# Patient Record
Sex: Female | Born: 1950 | Race: White | Hispanic: No | Smoking: Never smoker
Health system: Southern US, Community
[De-identification: ages and names within clinical notes are randomized; demographics above are authoritative.]

## PROBLEM LIST (undated history)

## (undated) DIAGNOSIS — D731 Hypersplenism: Secondary | ICD-10-CM

## (undated) DIAGNOSIS — R161 Splenomegaly, not elsewhere classified: Secondary | ICD-10-CM

## (undated) DIAGNOSIS — D696 Thrombocytopenia, unspecified: Secondary | ICD-10-CM

## (undated) DIAGNOSIS — R160 Hepatomegaly, not elsewhere classified: Secondary | ICD-10-CM

## (undated) DIAGNOSIS — K7460 Unspecified cirrhosis of liver: Secondary | ICD-10-CM

## (undated) DIAGNOSIS — F32A Depression, unspecified: Secondary | ICD-10-CM

## (undated) DIAGNOSIS — E669 Obesity, unspecified: Secondary | ICD-10-CM

## (undated) DIAGNOSIS — M1990 Unspecified osteoarthritis, unspecified site: Secondary | ICD-10-CM

## (undated) DIAGNOSIS — F329 Major depressive disorder, single episode, unspecified: Secondary | ICD-10-CM

## (undated) DIAGNOSIS — L409 Psoriasis, unspecified: Secondary | ICD-10-CM

## (undated) DIAGNOSIS — I10 Essential (primary) hypertension: Secondary | ICD-10-CM

## (undated) DIAGNOSIS — M8580 Other specified disorders of bone density and structure, unspecified site: Secondary | ICD-10-CM

## (undated) DIAGNOSIS — M545 Low back pain, unspecified: Secondary | ICD-10-CM

## (undated) DIAGNOSIS — D72819 Decreased white blood cell count, unspecified: Secondary | ICD-10-CM

## (undated) DIAGNOSIS — L4050 Arthropathic psoriasis, unspecified: Secondary | ICD-10-CM

## (undated) DIAGNOSIS — D6959 Other secondary thrombocytopenia: Secondary | ICD-10-CM

## (undated) HISTORY — DX: Hypersplenism: D73.1

## (undated) HISTORY — PX: OTHER SURGICAL HISTORY: SHX169

## (undated) HISTORY — DX: Low back pain: M54.5

## (undated) HISTORY — DX: Splenomegaly, not elsewhere classified: R16.1

## (undated) HISTORY — DX: Obesity, unspecified: E66.9

## (undated) HISTORY — DX: Other specified disorders of bone density and structure, unspecified site: M85.80

## (undated) HISTORY — DX: Arthropathic psoriasis, unspecified: L40.50

## (undated) HISTORY — DX: Low back pain, unspecified: M54.50

## (undated) HISTORY — DX: Unspecified osteoarthritis, unspecified site: M19.90

## (undated) HISTORY — DX: Thrombocytopenia, unspecified: D69.6

## (undated) HISTORY — DX: Psoriasis, unspecified: L40.9

## (undated) HISTORY — DX: Hepatomegaly, not elsewhere classified: R16.0

## (undated) HISTORY — DX: Decreased white blood cell count, unspecified: D72.819

## (undated) HISTORY — DX: Major depressive disorder, single episode, unspecified: F32.9

## (undated) HISTORY — DX: Essential (primary) hypertension: I10

## (undated) HISTORY — DX: Unspecified cirrhosis of liver: K74.60

## (undated) HISTORY — DX: Other secondary thrombocytopenia: D69.59

## (undated) HISTORY — DX: Depression, unspecified: F32.A

---

## 1997-12-11 ENCOUNTER — Ambulatory Visit (HOSPITAL_COMMUNITY): Admission: RE | Admit: 1997-12-11 | Discharge: 1997-12-11 | Payer: Self-pay | Admitting: Internal Medicine

## 2000-09-07 ENCOUNTER — Ambulatory Visit (HOSPITAL_BASED_OUTPATIENT_CLINIC_OR_DEPARTMENT_OTHER): Admission: RE | Admit: 2000-09-07 | Discharge: 2000-09-07 | Payer: Self-pay | Admitting: Pulmonary Disease

## 2000-11-20 ENCOUNTER — Encounter: Payer: Self-pay | Admitting: Emergency Medicine

## 2000-11-20 ENCOUNTER — Emergency Department (HOSPITAL_COMMUNITY): Admission: EM | Admit: 2000-11-20 | Discharge: 2000-11-21 | Payer: Self-pay | Admitting: Emergency Medicine

## 2003-10-29 ENCOUNTER — Encounter: Admission: RE | Admit: 2003-10-29 | Discharge: 2003-10-29 | Payer: Self-pay | Admitting: Internal Medicine

## 2004-04-22 ENCOUNTER — Ambulatory Visit: Payer: Self-pay | Admitting: Internal Medicine

## 2004-06-15 ENCOUNTER — Ambulatory Visit: Payer: Self-pay | Admitting: Internal Medicine

## 2004-06-24 ENCOUNTER — Ambulatory Visit: Payer: Self-pay | Admitting: Internal Medicine

## 2004-09-27 ENCOUNTER — Ambulatory Visit: Payer: Self-pay | Admitting: Internal Medicine

## 2004-10-20 ENCOUNTER — Ambulatory Visit: Payer: Self-pay | Admitting: Internal Medicine

## 2004-10-24 ENCOUNTER — Encounter: Admission: RE | Admit: 2004-10-24 | Discharge: 2004-12-09 | Payer: Self-pay | Admitting: Internal Medicine

## 2004-11-10 ENCOUNTER — Ambulatory Visit: Payer: Self-pay | Admitting: Internal Medicine

## 2004-12-09 ENCOUNTER — Ambulatory Visit: Payer: Self-pay | Admitting: Internal Medicine

## 2005-03-14 ENCOUNTER — Ambulatory Visit: Payer: Self-pay | Admitting: Internal Medicine

## 2005-04-04 ENCOUNTER — Ambulatory Visit: Payer: Self-pay | Admitting: Internal Medicine

## 2005-10-17 ENCOUNTER — Ambulatory Visit: Payer: Self-pay | Admitting: Internal Medicine

## 2005-11-16 ENCOUNTER — Ambulatory Visit: Payer: Self-pay | Admitting: Internal Medicine

## 2006-01-18 ENCOUNTER — Ambulatory Visit: Payer: Self-pay | Admitting: Internal Medicine

## 2006-05-01 ENCOUNTER — Ambulatory Visit: Payer: Self-pay | Admitting: Internal Medicine

## 2006-08-03 ENCOUNTER — Ambulatory Visit: Payer: Self-pay | Admitting: Internal Medicine

## 2006-11-21 ENCOUNTER — Ambulatory Visit: Payer: Self-pay | Admitting: Internal Medicine

## 2006-12-12 ENCOUNTER — Ambulatory Visit: Payer: Self-pay | Admitting: Internal Medicine

## 2007-01-24 ENCOUNTER — Ambulatory Visit: Payer: Self-pay | Admitting: Internal Medicine

## 2007-05-01 ENCOUNTER — Encounter: Payer: Self-pay | Admitting: Internal Medicine

## 2007-05-24 ENCOUNTER — Ambulatory Visit: Payer: Self-pay | Admitting: Internal Medicine

## 2007-05-24 DIAGNOSIS — R5381 Other malaise: Secondary | ICD-10-CM

## 2007-05-24 DIAGNOSIS — M949 Disorder of cartilage, unspecified: Secondary | ICD-10-CM

## 2007-05-24 DIAGNOSIS — R5383 Other fatigue: Secondary | ICD-10-CM

## 2007-05-24 DIAGNOSIS — F329 Major depressive disorder, single episode, unspecified: Secondary | ICD-10-CM

## 2007-05-24 DIAGNOSIS — M899 Disorder of bone, unspecified: Secondary | ICD-10-CM | POA: Insufficient documentation

## 2007-05-24 DIAGNOSIS — G4700 Insomnia, unspecified: Secondary | ICD-10-CM

## 2007-05-24 DIAGNOSIS — M1990 Unspecified osteoarthritis, unspecified site: Secondary | ICD-10-CM

## 2007-05-24 DIAGNOSIS — M545 Low back pain: Secondary | ICD-10-CM

## 2007-05-24 DIAGNOSIS — I10 Essential (primary) hypertension: Secondary | ICD-10-CM

## 2007-05-28 ENCOUNTER — Encounter: Payer: Self-pay | Admitting: Internal Medicine

## 2007-06-17 ENCOUNTER — Telehealth: Payer: Self-pay | Admitting: Internal Medicine

## 2007-06-24 ENCOUNTER — Telehealth: Payer: Self-pay | Admitting: Internal Medicine

## 2007-07-02 ENCOUNTER — Emergency Department (HOSPITAL_COMMUNITY): Admission: EM | Admit: 2007-07-02 | Discharge: 2007-07-02 | Payer: Self-pay | Admitting: Family Medicine

## 2007-07-19 ENCOUNTER — Telehealth: Payer: Self-pay | Admitting: Internal Medicine

## 2007-07-22 ENCOUNTER — Encounter: Payer: Self-pay | Admitting: Internal Medicine

## 2007-08-01 ENCOUNTER — Encounter: Payer: Self-pay | Admitting: Internal Medicine

## 2007-08-23 ENCOUNTER — Ambulatory Visit (HOSPITAL_COMMUNITY): Admission: RE | Admit: 2007-08-23 | Discharge: 2007-08-23 | Payer: Self-pay | Admitting: General Surgery

## 2007-09-11 ENCOUNTER — Ambulatory Visit: Payer: Self-pay | Admitting: Internal Medicine

## 2007-09-11 DIAGNOSIS — J309 Allergic rhinitis, unspecified: Secondary | ICD-10-CM | POA: Insufficient documentation

## 2007-09-11 DIAGNOSIS — J019 Acute sinusitis, unspecified: Secondary | ICD-10-CM

## 2007-10-09 ENCOUNTER — Encounter: Admission: RE | Admit: 2007-10-09 | Discharge: 2007-10-09 | Payer: Self-pay | Admitting: General Surgery

## 2007-12-10 ENCOUNTER — Ambulatory Visit: Payer: Self-pay | Admitting: Internal Medicine

## 2007-12-10 LAB — CONVERTED CEMR LAB
AST: 30 units/L (ref 0–37)
Albumin: 3.6 g/dL (ref 3.5–5.2)
Alkaline Phosphatase: 103 units/L (ref 39–117)
BUN: 13 mg/dL (ref 6–23)
Basophils Relative: 1 % (ref 0.0–1.0)
Chloride: 103 meq/L (ref 96–112)
Creatinine, Ser: 0.8 mg/dL (ref 0.4–1.2)
Eosinophils Relative: 2.1 % (ref 0.0–5.0)
Glucose, Bld: 105 mg/dL — ABNORMAL HIGH (ref 70–99)
HCT: 37.7 % (ref 36.0–46.0)
HDL: 39.7 mg/dL (ref >39.0)
MCV: 86.9 fL (ref 78.0–100.0)
Monocytes Absolute: 0.3 10*3/uL (ref 0.1–1.0)
Monocytes Relative: 7.2 % (ref 3.0–12.0)
Neutrophils Relative %: 66.3 % (ref 43.0–77.0)
Platelets: 109 10*3/uL — ABNORMAL LOW (ref 150–400)
Potassium: 4 meq/L (ref 3.5–5.1)
RBC: 4.33 M/uL (ref 3.87–5.11)
TSH: 2.01 microintl units/mL (ref 0.35–5.50)
Total CHOL/HDL Ratio: 4.2
Total Protein: 6.5 g/dL (ref 6.0–8.3)
WBC: 3.5 10*3/uL — ABNORMAL LOW (ref 4.5–10.5)

## 2007-12-23 ENCOUNTER — Ambulatory Visit: Payer: Self-pay | Admitting: Internal Medicine

## 2008-02-24 ENCOUNTER — Telehealth (INDEPENDENT_AMBULATORY_CARE_PROVIDER_SITE_OTHER): Payer: Self-pay | Admitting: *Deleted

## 2008-02-25 ENCOUNTER — Ambulatory Visit: Payer: Self-pay | Admitting: Internal Medicine

## 2008-02-25 DIAGNOSIS — J0100 Acute maxillary sinusitis, unspecified: Secondary | ICD-10-CM

## 2008-04-21 ENCOUNTER — Ambulatory Visit: Payer: Self-pay | Admitting: Internal Medicine

## 2008-04-21 LAB — CONVERTED CEMR LAB
AST: 27 units/L (ref 0–37)
Basophils Absolute: 0 10*3/uL (ref 0.0–0.1)
Basophils Relative: 1 % (ref 0.0–3.0)
Bilirubin Urine: NEGATIVE
Bilirubin, Direct: 0.2 mg/dL (ref 0.0–0.3)
Chloride: 108 meq/L (ref 96–112)
Creatinine, Ser: 0.8 mg/dL (ref 0.4–1.2)
Eosinophils Absolute: 0.1 10*3/uL (ref 0.0–0.7)
GFR calc non Af Amer: 78 mL/min
Hemoglobin, Urine: NEGATIVE
Ketones, ur: NEGATIVE mg/dL
MCHC: 33.9 g/dL (ref 30.0–36.0)
MCV: 87.6 fL (ref 78.0–100.0)
Neutrophils Relative %: 65.7 % (ref 43.0–77.0)
Platelets: 115 10*3/uL — ABNORMAL LOW (ref 150–400)
RBC: 4.46 M/uL (ref 3.87–5.11)
RDW: 13.8 % (ref 11.5–14.6)
Sodium: 143 meq/L (ref 135–145)
TSH: 2.62 microintl units/mL (ref 0.35–5.50)
Total Bilirubin: 1.1 mg/dL (ref 0.3–1.2)
Urine Glucose: NEGATIVE mg/dL
Urobilinogen, UA: 1 (ref 0.0–1.0)

## 2008-04-28 ENCOUNTER — Ambulatory Visit: Payer: Self-pay | Admitting: Internal Medicine

## 2008-06-25 ENCOUNTER — Telehealth: Payer: Self-pay | Admitting: Internal Medicine

## 2008-07-28 ENCOUNTER — Ambulatory Visit: Payer: Self-pay | Admitting: Internal Medicine

## 2008-07-29 LAB — CONVERTED CEMR LAB
BUN: 9 mg/dL (ref 6–23)
Calcium: 9.2 mg/dL (ref 8.4–10.5)
Eosinophils Relative: 4.2 % (ref 0.0–5.0)
GFR calc Af Amer: 110 mL/min
Glucose, Bld: 130 mg/dL — ABNORMAL HIGH (ref 70–99)
HCT: 39.3 % (ref 36.0–46.0)
Hemoglobin: 13.5 g/dL (ref 12.0–15.0)
Monocytes Absolute: 0.2 10*3/uL (ref 0.1–1.0)
Monocytes Relative: 3.9 % (ref 3.0–12.0)
Neutro Abs: 3.1 10*3/uL (ref 1.4–7.7)
RDW: 13.5 % (ref 11.5–14.6)
WBC: 4.4 10*3/uL — ABNORMAL LOW (ref 4.5–10.5)

## 2008-09-17 ENCOUNTER — Telehealth: Payer: Self-pay | Admitting: Internal Medicine

## 2008-09-22 ENCOUNTER — Telehealth: Payer: Self-pay | Admitting: Internal Medicine

## 2008-09-22 ENCOUNTER — Encounter: Payer: Self-pay | Admitting: Internal Medicine

## 2008-10-27 ENCOUNTER — Telehealth: Payer: Self-pay | Admitting: Internal Medicine

## 2008-10-28 ENCOUNTER — Ambulatory Visit: Payer: Self-pay | Admitting: Internal Medicine

## 2008-10-29 LAB — CONVERTED CEMR LAB
BUN: 10 mg/dL (ref 6–23)
Basophils Absolute: 0 10*3/uL (ref 0.0–0.1)
Calcium: 8.9 mg/dL (ref 8.4–10.5)
GFR calc non Af Amer: 77.94 mL/min (ref >60)
Glucose, Bld: 102 mg/dL — ABNORMAL HIGH (ref 70–99)
Hgb A1c MFr Bld: 4.9 % (ref 4.6–6.5)
Lymphocytes Relative: 18.5 % (ref 12.0–46.0)
Lymphs Abs: 0.7 10*3/uL (ref 0.7–4.0)
Monocytes Relative: 7.3 % (ref 3.0–12.0)
Platelets: 93 10*3/uL — ABNORMAL LOW (ref 150.0–400.0)
RDW: 13.3 % (ref 11.5–14.6)

## 2008-11-03 ENCOUNTER — Ambulatory Visit: Payer: Self-pay | Admitting: Internal Medicine

## 2008-11-03 DIAGNOSIS — H02829 Cysts of unspecified eye, unspecified eyelid: Secondary | ICD-10-CM

## 2009-02-01 ENCOUNTER — Ambulatory Visit: Payer: Self-pay | Admitting: Internal Medicine

## 2009-02-01 LAB — CONVERTED CEMR LAB
Basophils Relative: 0.1 % (ref 0.0–3.0)
Chloride: 107 meq/L (ref 96–112)
Creatinine, Ser: 0.7 mg/dL (ref 0.4–1.2)
Eosinophils Relative: 2.2 % (ref 0.0–5.0)
GFR calc non Af Amer: 90.84 mL/min (ref >60)
Lymphocytes Relative: 21.1 % (ref 12.0–46.0)
MCV: 87.9 fL (ref 78.0–100.0)
Monocytes Absolute: 0.2 10*3/uL (ref 0.1–1.0)
Monocytes Relative: 5.1 % (ref 3.0–12.0)
Neutrophils Relative %: 71.5 % (ref 43.0–77.0)
RBC: 4.61 M/uL (ref 3.87–5.11)
WBC: 4 10*3/uL — ABNORMAL LOW (ref 4.5–10.5)

## 2009-02-05 ENCOUNTER — Ambulatory Visit: Payer: Self-pay | Admitting: Internal Medicine

## 2009-02-05 LAB — CONVERTED CEMR LAB: IgE (Immunoglobulin E), Serum: 22.7 intl units/mL (ref 0.0–180.0)

## 2009-04-06 ENCOUNTER — Ambulatory Visit: Payer: Self-pay | Admitting: Internal Medicine

## 2009-04-06 DIAGNOSIS — L659 Nonscarring hair loss, unspecified: Secondary | ICD-10-CM | POA: Insufficient documentation

## 2009-04-07 LAB — CONVERTED CEMR LAB
AST: 32 units/L (ref 0–37)
Albumin: 3.8 g/dL (ref 3.5–5.2)
Alkaline Phosphatase: 116 units/L (ref 39–117)
Basophils Relative: 0.5 % (ref 0.0–3.0)
Bilirubin Urine: NEGATIVE
Bilirubin, Direct: 0.2 mg/dL (ref 0.0–0.3)
Calcium: 9.1 mg/dL (ref 8.4–10.5)
GFR calc non Af Amer: 67.93 mL/min (ref >60)
Hemoglobin, Urine: NEGATIVE
Hemoglobin: 14.2 g/dL (ref 12.0–15.0)
Lymphocytes Relative: 21.8 % (ref 12.0–46.0)
Monocytes Relative: 4.2 % (ref 3.0–12.0)
Neutro Abs: 3.2 10*3/uL (ref 1.4–7.7)
Neutrophils Relative %: 71.5 % (ref 43.0–77.0)
Nitrite: NEGATIVE
Potassium: 4.2 meq/L (ref 3.5–5.1)
Pro B Natriuretic peptide (BNP): 29 pg/mL (ref 0.0–100.0)
RBC: 4.56 M/uL (ref 3.87–5.11)
Sodium: 142 meq/L (ref 135–145)
Total Protein, Urine: NEGATIVE mg/dL
Total Protein: 7 g/dL (ref 6.0–8.3)
Urine Glucose: NEGATIVE mg/dL
Vitamin B-12: 504 pg/mL (ref 211–911)
WBC: 4.5 10*3/uL (ref 4.5–10.5)
pH: 7 (ref 5.0–8.0)

## 2009-04-19 ENCOUNTER — Telehealth: Payer: Self-pay | Admitting: Internal Medicine

## 2009-05-25 ENCOUNTER — Ambulatory Visit: Payer: Self-pay | Admitting: Internal Medicine

## 2009-05-25 DIAGNOSIS — M25559 Pain in unspecified hip: Secondary | ICD-10-CM | POA: Insufficient documentation

## 2009-07-27 ENCOUNTER — Ambulatory Visit: Payer: Self-pay | Admitting: Internal Medicine

## 2009-09-06 ENCOUNTER — Telehealth: Payer: Self-pay | Admitting: Internal Medicine

## 2009-09-07 ENCOUNTER — Encounter: Admission: RE | Admit: 2009-09-07 | Discharge: 2009-09-07 | Payer: Self-pay | Admitting: Rheumatology

## 2009-09-07 ENCOUNTER — Encounter: Payer: Self-pay | Admitting: Internal Medicine

## 2009-09-08 ENCOUNTER — Telehealth: Payer: Self-pay | Admitting: Internal Medicine

## 2009-09-08 DIAGNOSIS — E039 Hypothyroidism, unspecified: Secondary | ICD-10-CM | POA: Insufficient documentation

## 2009-10-14 ENCOUNTER — Ambulatory Visit: Payer: Self-pay | Admitting: Internal Medicine

## 2009-10-18 ENCOUNTER — Ambulatory Visit: Payer: Self-pay | Admitting: Internal Medicine

## 2009-10-18 LAB — CONVERTED CEMR LAB: TSH: 4.04 microintl units/mL (ref 0.35–5.50)

## 2009-10-20 ENCOUNTER — Ambulatory Visit: Payer: Self-pay | Admitting: Internal Medicine

## 2009-10-20 DIAGNOSIS — R609 Edema, unspecified: Secondary | ICD-10-CM

## 2009-11-04 ENCOUNTER — Encounter: Payer: Self-pay | Admitting: Internal Medicine

## 2009-11-05 ENCOUNTER — Ambulatory Visit: Payer: Self-pay | Admitting: Oncology

## 2009-11-12 ENCOUNTER — Encounter: Payer: Self-pay | Admitting: Internal Medicine

## 2009-11-12 LAB — COMPREHENSIVE METABOLIC PANEL
ALT: 18 U/L (ref 0–35)
AST: 25 U/L (ref 0–37)
Alkaline Phosphatase: 105 U/L (ref 39–117)
Sodium: 139 mEq/L (ref 135–145)
Total Bilirubin: 1 mg/dL (ref 0.3–1.2)
Total Protein: 6.9 g/dL (ref 6.0–8.3)

## 2009-11-12 LAB — MORPHOLOGY: PLT EST: DECREASED

## 2009-11-12 LAB — CBC WITH DIFFERENTIAL/PLATELET
Basophils Absolute: 0 10*3/uL (ref 0.0–0.1)
Eosinophils Absolute: 0.1 10*3/uL (ref 0.0–0.5)
HGB: 12.2 g/dL (ref 11.6–15.9)
MCV: 86.5 fL (ref 79.5–101.0)
MONO%: 6.1 % (ref 0.0–14.0)
NEUT#: 2.3 10*3/uL (ref 1.5–6.5)
RDW: 14.3 % (ref 11.2–14.5)
lymph#: 0.8 10*3/uL — ABNORMAL LOW (ref 0.9–3.3)

## 2009-11-12 LAB — CHCC SMEAR

## 2009-11-15 ENCOUNTER — Telehealth: Payer: Self-pay | Admitting: Internal Medicine

## 2009-11-16 LAB — PROTEIN ELECTROPHORESIS, SERUM
Albumin ELP: 55.6 % — ABNORMAL LOW (ref 55.8–66.1)
Alpha-1-Globulin: 4.4 % (ref 2.9–4.9)
Beta 2: 4.1 % (ref 3.2–6.5)
Beta Globulin: 6.5 % (ref 4.7–7.2)

## 2009-11-16 LAB — HEPATITIS B SURFACE ANTIGEN: Hepatitis B Surface Ag: NEGATIVE

## 2009-11-16 LAB — HEPATITIS B CORE ANTIBODY, TOTAL: Hep B Core Total Ab: NEGATIVE

## 2009-11-16 LAB — HEPATITIS C ANTIBODY: HCV Ab: NEGATIVE

## 2009-11-16 LAB — HEPATITIS B SURFACE ANTIBODY,QUALITATIVE: Hep B S Ab: NEGATIVE

## 2009-11-16 LAB — HIV ANTIBODY (ROUTINE TESTING W REFLEX)

## 2009-11-24 ENCOUNTER — Ambulatory Visit (HOSPITAL_COMMUNITY): Admission: RE | Admit: 2009-11-24 | Discharge: 2009-11-24 | Payer: Self-pay | Admitting: Oncology

## 2009-11-29 ENCOUNTER — Ambulatory Visit (HOSPITAL_COMMUNITY)
Admission: RE | Admit: 2009-11-29 | Discharge: 2009-11-29 | Payer: Self-pay | Source: Home / Self Care | Admitting: Oncology

## 2009-12-08 ENCOUNTER — Ambulatory Visit: Payer: Self-pay | Admitting: Oncology

## 2009-12-08 ENCOUNTER — Telehealth: Payer: Self-pay | Admitting: Internal Medicine

## 2009-12-10 ENCOUNTER — Encounter: Payer: Self-pay | Admitting: Internal Medicine

## 2009-12-10 LAB — CBC WITH DIFFERENTIAL/PLATELET
Basophils Absolute: 0 10*3/uL (ref 0.0–0.1)
EOS%: 4.1 % (ref 0.0–7.0)
HGB: 13 g/dL (ref 11.6–15.9)
MCH: 30.3 pg (ref 25.1–34.0)
NEUT#: 2.2 10*3/uL (ref 1.5–6.5)
RDW: 14.8 % — ABNORMAL HIGH (ref 11.2–14.5)
WBC: 3.3 10*3/uL — ABNORMAL LOW (ref 3.9–10.3)
lymph#: 0.7 10*3/uL — ABNORMAL LOW (ref 0.9–3.3)

## 2009-12-13 ENCOUNTER — Telehealth: Payer: Self-pay | Admitting: Internal Medicine

## 2010-01-14 ENCOUNTER — Ambulatory Visit: Payer: Self-pay | Admitting: Internal Medicine

## 2010-01-14 LAB — CONVERTED CEMR LAB
CO2: 31 meq/L (ref 19–32)
Chloride: 108 meq/L (ref 96–112)
Creatinine, Ser: 0.7 mg/dL (ref 0.4–1.2)
Potassium: 4.3 meq/L (ref 3.5–5.1)
TSH: 1.37 microintl units/mL (ref 0.35–5.50)

## 2010-01-26 ENCOUNTER — Ambulatory Visit: Payer: Self-pay | Admitting: Internal Medicine

## 2010-01-26 DIAGNOSIS — R160 Hepatomegaly, not elsewhere classified: Secondary | ICD-10-CM | POA: Insufficient documentation

## 2010-01-27 ENCOUNTER — Encounter: Payer: Self-pay | Admitting: Internal Medicine

## 2010-02-03 ENCOUNTER — Ambulatory Visit: Payer: Self-pay | Admitting: Oncology

## 2010-02-08 LAB — CBC WITH DIFFERENTIAL/PLATELET
Basophils Absolute: 0 10*3/uL (ref 0.0–0.1)
Eosinophils Absolute: 0.2 10*3/uL (ref 0.0–0.5)
HCT: 37.2 % (ref 34.8–46.6)
HGB: 12.6 g/dL (ref 11.6–15.9)
LYMPH%: 19.8 % (ref 14.0–49.7)
MONO#: 0.2 10*3/uL (ref 0.1–0.9)
NEUT#: 2.3 10*3/uL (ref 1.5–6.5)
NEUT%: 67.4 % (ref 38.4–76.8)
Platelets: 91 10*3/uL — ABNORMAL LOW (ref 145–400)
WBC: 3.4 10*3/uL — ABNORMAL LOW (ref 3.9–10.3)

## 2010-02-08 LAB — MORPHOLOGY: PLT EST: DECREASED

## 2010-02-08 LAB — CHCC SMEAR

## 2010-03-28 ENCOUNTER — Telehealth: Payer: Self-pay | Admitting: Internal Medicine

## 2010-03-31 ENCOUNTER — Telehealth: Payer: Self-pay | Admitting: Internal Medicine

## 2010-04-13 ENCOUNTER — Ambulatory Visit (HOSPITAL_BASED_OUTPATIENT_CLINIC_OR_DEPARTMENT_OTHER): Payer: BC Managed Care – PPO | Admitting: Oncology

## 2010-04-15 ENCOUNTER — Encounter: Payer: Self-pay | Admitting: Internal Medicine

## 2010-04-15 LAB — COMPREHENSIVE METABOLIC PANEL
ALT: 18 U/L (ref 0–35)
Albumin: 3.9 g/dL (ref 3.5–5.2)
CO2: 27 mEq/L (ref 19–32)
Calcium: 9.1 mg/dL (ref 8.4–10.5)
Chloride: 106 mEq/L (ref 96–112)
Glucose, Bld: 110 mg/dL — ABNORMAL HIGH (ref 70–99)
Potassium: 4.2 mEq/L (ref 3.5–5.3)
Sodium: 142 mEq/L (ref 135–145)
Total Bilirubin: 1.2 mg/dL (ref 0.3–1.2)
Total Protein: 7 g/dL (ref 6.0–8.3)

## 2010-04-15 LAB — CBC WITH DIFFERENTIAL/PLATELET
Basophils Absolute: 0 10*3/uL (ref 0.0–0.1)
EOS%: 3.1 % (ref 0.0–7.0)
HGB: 13.1 g/dL (ref 11.6–15.9)
LYMPH%: 22.4 % (ref 14.0–49.7)
MCH: 29.3 pg (ref 25.1–34.0)
MCV: 89.7 fL (ref 79.5–101.0)
MONO%: 5.9 % (ref 0.0–14.0)
Platelets: 86 10*3/uL — ABNORMAL LOW (ref 145–400)
RDW: 14.3 % (ref 11.2–14.5)

## 2010-04-15 LAB — MORPHOLOGY

## 2010-04-15 LAB — CHCC SMEAR

## 2010-04-15 LAB — LACTATE DEHYDROGENASE: LDH: 206 U/L (ref 94–250)

## 2010-05-09 ENCOUNTER — Telehealth: Payer: Self-pay | Admitting: Internal Medicine

## 2010-05-26 ENCOUNTER — Ambulatory Visit: Payer: Self-pay | Admitting: Internal Medicine

## 2010-06-02 ENCOUNTER — Ambulatory Visit: Payer: Self-pay | Admitting: Internal Medicine

## 2010-06-02 DIAGNOSIS — D693 Immune thrombocytopenic purpura: Secondary | ICD-10-CM

## 2010-06-07 LAB — CONVERTED CEMR LAB
Albumin: 3.6 g/dL (ref 3.5–5.2)
Alkaline Phosphatase: 96 units/L (ref 39–117)
Basophils Absolute: 0 10*3/uL (ref 0.0–0.1)
Basophils Relative: 0.5 % (ref 0.0–3.0)
CO2: 28 meq/L (ref 19–32)
Chloride: 102 meq/L (ref 96–112)
Cholesterol: 164 mg/dL (ref 0–200)
Eosinophils Absolute: 0.1 10*3/uL (ref 0.0–0.7)
Glucose, Bld: 90 mg/dL (ref 70–99)
HCT: 39.8 % (ref 36.0–46.0)
HDL: 43.5 mg/dL (ref >39.00)
Hemoglobin: 13.3 g/dL (ref 12.0–15.0)
Ketones, ur: NEGATIVE mg/dL
Leukocytes, UA: NEGATIVE
Lymphocytes Relative: 22.8 % (ref 12.0–46.0)
Lymphs Abs: 0.9 10*3/uL (ref 0.7–4.0)
MCHC: 33.5 g/dL (ref 30.0–36.0)
MCV: 89.6 fL (ref 78.0–100.0)
Monocytes Absolute: 0.3 10*3/uL (ref 0.1–1.0)
Neutro Abs: 2.7 10*3/uL (ref 1.4–7.7)
Nitrite: NEGATIVE
Potassium: 4.1 meq/L (ref 3.5–5.1)
RBC: 4.44 M/uL (ref 3.87–5.11)
RDW: 14.8 % — ABNORMAL HIGH (ref 11.5–14.6)
Sodium: 138 meq/L (ref 135–145)
Specific Gravity, Urine: 1.01 (ref 1.000–1.030)
TSH: 1.12 microintl units/mL (ref 0.35–5.50)
Total Protein, Urine: NEGATIVE mg/dL
Total Protein: 7 g/dL (ref 6.0–8.3)
VLDL: 19.4 mg/dL (ref 0.0–40.0)
pH: 7 (ref 5.0–8.0)

## 2010-06-10 ENCOUNTER — Telehealth: Payer: Self-pay | Admitting: Internal Medicine

## 2010-06-30 ENCOUNTER — Ambulatory Visit
Admission: RE | Admit: 2010-06-30 | Discharge: 2010-06-30 | Payer: Self-pay | Source: Home / Self Care | Attending: Internal Medicine | Admitting: Internal Medicine

## 2010-07-07 NOTE — Letter (Signed)
Summary: [facility]  [facility]   Imported By: Sherian Rein 09/29/2009 08:06:16  _____________________________________________________________________  External Attachment:    Type:   Image     Comment:   External Document

## 2010-07-07 NOTE — Progress Notes (Signed)
Summary: Vicodin   Phone Note From Pharmacy   Summary of Call: Rec rf request from [facility] and [facility] for Vicodin 5/500mg .  I spoke to pt and advised her Rf was called in to [place] last week.  She states she did not request Rf there and wishes to use [facility].  I called [facility] and they state RX is ready to be picked up.  I advised [facility] pharm to put medication back and to void the RX.  Rf called into [facility] per pt's request and Dr. Loren Racer authorization.   Initial call taken by: Lanier Prude, [facility]),  December 13, 2009 11:15 AM

## 2010-07-07 NOTE — Progress Notes (Signed)
  Phone Note Other Incoming   Caller: Pt- work # [phone] Summary of Call: pt states she has had sinus infection x 2 days, with green mucous. Can you call something in or do you want pt to be seen? Initial call taken by: Ami Bullins CMA,  November 15, 2009 9:19 AM  Follow-up for Phone Call        OK [facility] Follow-up by: Tresa Garter MD,  November 15, 2009 4:55 PM    New/Updated Medications: ZITHROMAX Z-PAK 250 MG TABS (AZITHROMYCIN) as dirrected Prescriptions: ZITHROMAX Z-PAK 250 MG TABS (AZITHROMYCIN) as dirrected  #1 x 0   Entered by:   Ami Bullins CMA   Authorized by:   Tresa Garter MD   Signed by:   Bill Salinas CMA on 11/16/2009   Method used:   Electronically to        [facility] (retail)       [address]       [place], [place]  [address]       Ph: [phone]       Fax: [phone]   RxID:   [id number]

## 2010-07-07 NOTE — Progress Notes (Signed)
Summary: vicodin refill  Phone Note Refill Request Message from:  Fax from Pharmacy on September 06, 2009 2:28 PM  Refills Requested: Medication #1:  VICODIN 5-500 MG TABS 1-2 tab two times a day as needed pain Next Appointment Scheduled: 10-04-09 Initial call taken by: Lucious Groves,  September 06, 2009 2:28 PM  Follow-up for Phone Call        ok x 2 ref Follow-up by: Tresa Garter MD,  September 06, 2009 5:12 PM    Prescriptions: VICODIN 5-500 MG TABS (HYDROCODONE-ACETAMINOPHEN) 1-2 tab two times a day as needed pain  #120 x 2   Entered by:   Lamar Sprinkles, CMA   Authorized by:   Tresa Garter MD   Signed by:   Lamar Sprinkles, CMA on 09/07/2009   Method used:   Telephoned to ...       [facility]  [address] [phone]* (retail)       [address]       [place], [place]  [address]       Ph: [phone]       Fax: [phone]   RxID:   [id number]

## 2010-07-07 NOTE — Assessment & Plan Note (Signed)
Summary: PNEMONIA INJ/AVP Natale Milch  Nurse Visit   Allergies: 1)  ! Pcn 2)  Maxzide 3)  Enalapril Maleate 4)  Ibuprofen 5)  Spironolactone  Immunizations Administered:  Pneumonia Vaccine:    Vaccine Type: Pneumovax    Site: right deltoid    Mfr: Merck    Dose: 0.5 ml    Route: IM    Given by: Lamar Sprinkles, CMA    Exp. Date: 01/15/2011    Lot #: [id number]    VIS given: 01/01/96 version given Oct 14, 2009.  Orders Added: 1)  Pneumococcal Vaccine [90732] 2)  Admin 1st Vaccine [04540]

## 2010-07-07 NOTE — Assessment & Plan Note (Signed)
Summary: 3 MO ROV /NWS  #   Vital Signs:  Patient profile:   60 year old female Height:      63 inches Weight:      267 pounds BMI:     47.47 O2 Sat:      96 % on Room air Temp:     98.2 degrees F oral Pulse rate:   78 / minute Pulse rhythm:   regular Resp:     16 per minute BP sitting:   128 / 74  (left arm) Cuff size:   large  Vitals Entered By: Lanier Prude, CMA(AAMA) (January 26, 2010 10:18 AM)  O2 Flow:  Room air CC: f/u Is Patient Diabetic? No Comments pt is not using Dovonex or Mometasone   CC:  f/u.  History of Present Illness: The patient presents for a follow up of  low plts, hepatomegaly, hypertension, hyperlipidemia, alopecia   Preventive Screening-Counseling & Management  Caffeine-Diet-Exercise     Does Patient Exercise: no  Current Medications (verified): 1)  Temazepam 15 Mg Caps (Temazepam) .Marland Kitchen.. 1-2 Prn Insomnia 2)  Vicodin 5-500 Mg Tabs (Hydrocodone-Acetaminophen) .Marland Kitchen.. 1-2 Tab Two Times A Day As Needed Pain 3)  Cozaar 100 Mg Tabs (Losartan Potassium) .... Take 1 Tablet By Mouth Once A Day 4)  Vitamin D3 1000 Unit  Tabs (Cholecalciferol) .Marland Kitchen.. 1 Qd 5)  Vitamin B-12 1000 Mcg  Tabs (Cyanocobalamin) .Marland Kitchen.. 1 Qd 6)  Loratadine 10 Mg  Tabs (Loratadine) .... Once Daily As Needed Allergies 7)  Veramyst 27.5 Mcg/spray Susp (Fluticasone Furoate) .... 2 Puffs Each Nostril  Q. Day 8)  Triamcinolone Acetonide 0.1 % Oint (Triamcinolone Acetonide) .... Use Bid 9)  Acidophilus  Caps (Lactobacillus) .... As Needed 10)  Triamcinolone 0.5% Cream in Eucerin Cream 1:3 .... Use Two Times A Day Prn 11)  Dovonex 0.005 % Crea (Calcipotriene) .... Use Bid 12)  Mometasone Furoate 0.1 % Oint (Mometasone Furoate) .... Use Two Times A Day - Three Times A Day Prn 13)  Synthroid 50 Mcg Tabs (Levothyroxine Sodium) .Marland Kitchen.. 1 By Mouth Once Daily For Thyroid 14)  Furosemide 40 Mg Tabs (Furosemide) .Marland Kitchen.. 1 By Mouth Q Am ( A Water Pill) 15)  Klor-Con M10 10 Meq Cr-Tabs (Potassium Chloride Crys  Cr) .Marland Kitchen.. 1 By Mouth Qd  Allergies (verified): 1)  ! 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 Family History of Arthritis Family History Hypertension  Social History: Reviewed history from 05/24/2007 and no changes required. 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         Lost wt on diet  Physical Exam  General:  overweight-appearing.   Head:  Normocephalic and atraumatic without obvious abnormalities. Ears:  External ear exam shows rash.  Otoscopic examination reveals clear canals, tympanic membranes are intact bilaterally without bulging, retraction, inflammation or discharge. Hearing is grossly normal bilaterally. Nose:  Erythematous throat mucosa and intranasal erythema.  Mouth:  Oral mucosa and oropharynx without lesions or exudates.  Teeth in  good repair. Neck:  No deformities, masses, or tenderness noted. Lungs:  normal respiratory effort, no intercostal retractions, no accessory muscle use, normal breath sounds, no dullness, and no fremitus.   Heart:  normal rate, regular rhythm, no murmur, no gallop, and no rub.   Abdomen:  soft, non-tender, normal bowel sounds, no hepatomegaly, and no splenomegaly.   Msk:  Lumbar-sacral spine is tender to palpation over paraspinal muscles and painfull with the ROM  Extremities:  trace left pedal  edema and trace right pedal edema.   Neurologic:  No cranial nerve deficits noted. Station and gait are normal. Plantar reflexes are down-going bilaterally. DTRs are symmetrical throughout. Sensory, motor and coordinative functions appear intact. Skin:   extensive psoriasis rash all over. Hair thinned some Psych:  Oriented X3, memory intact for recent and remote, normally interactive, good eye contact, not anxious appearing, not depressed appearing, and not agitated.     Contraindications/Deferment of Procedures/Staging:    Treatment: Flu Shot    Contraindication: other   Impression & Recommendations:  Problem # 1:  HYPOTHYROIDISM (ICD-244.9) Assessment Unchanged  Her updated medication list for this problem includes:    Synthroid 50 Mcg Tabs (Levothyroxine sodium) .Marland Kitchen... 1 by mouth once daily for thyroid The labs were reviewed with the patient.   Problem # 2:  EDEMA (ICD-782.3) Assessment: Improved  Her updated medication list for this problem includes:    Furosemide 40 Mg Tabs (Furosemide) .Marland Kitchen... 1 by mouth q am ( a water pill)  Problem # 3:  ALOPECIA (ICD-704.00) Assessment: Improved  Problem # 4:  THROMBOCYTOPENIA (ICD-287.5) Assessment: Unchanged s/p bone marrow biopsy   Problem # 5:  HEPATOMEGALY (ICD-789.1) Assessment: Unchanged GI appt at [facility] tomorrow is pending   Complete Medication List: 1)  Temazepam 15 Mg Caps (Temazepam) .Marland Kitchen.. 1-2 prn insomnia 2)  Vicodin 5-500 Mg Tabs (Hydrocodone-acetaminophen) .Marland Kitchen.. 1-2 tab two times a day as needed pain 3)  Cozaar 100 Mg Tabs (Losartan potassium) .... Take 1 tablet by mouth once a day 4)  Vitamin D3 1000 Unit Tabs (Cholecalciferol) .Marland Kitchen.. 1 qd 5)  Vitamin B-12 1000 Mcg Tabs (Cyanocobalamin) .Marland Kitchen.. 1 qd 6)  Loratadine 10 Mg Tabs (Loratadine) .... Once daily as needed allergies 7)  Veramyst 27.5 Mcg/spray Susp (Fluticasone furoate) .... 2 puffs each nostril  q. day 8)  Triamcinolone Acetonide 0.1 % Oint (Triamcinolone  acetonide) .... Use bid 9)  Acidophilus Caps (Lactobacillus) .... As needed 10)  Triamcinolone 0.5% Cream in Eucerin Cream 1:3  .... Use two times a day prn 11)  Dovonex 0.005 % Crea (Calcipotriene) .... Use bid 12)  Mometasone Furoate 0.1 % Oint (Mometasone furoate) .... Use two times a day - three times a day prn 13)  Synthroid 50 Mcg Tabs (Levothyroxine sodium) .Marland Kitchen.. 1 by mouth once daily for thyroid 14)  Furosemide 40 Mg Tabs (Furosemide) .Marland Kitchen.. 1 by mouth q am ( a water pill) 15)  Klor-con M10 10 Meq Cr-tabs (Potassium chloride crys cr) .Marland Kitchen.. 1 by mouth qd  Patient Instructions: 1)  Please schedule a follow-up appointment in 4 months well w/labs.

## 2010-07-07 NOTE — Assessment & Plan Note (Signed)
Summary: 2 MO ROV /NWS   Vital Signs:  Patient profile:   60 year old female Weight:      260 pounds Temp:     97.1 degrees F oral Pulse rate:   82 / minute BP sitting:   112 / 64  (left arm)  Vitals Entered By: Tora Perches (July 27, 2009 9:33 AM) CC: f/u Is Patient Diabetic? No   CC:  f/u.  History of Present Illness: C/o psoriasis flare x 2-3 wks  Preventive Screening-Counseling & Management  Alcohol-Tobacco     Smoking Status: never  Current Medications (verified): 1)  Temazepam 15 Mg Caps (Temazepam) .Marland Kitchen.. 1-2 Prn Insomnia 2)  Vicodin 5-500 Mg Tabs (Hydrocodone-Acetaminophen) .Marland Kitchen.. 1-2 Tab Two Times A Day As Needed Pain 3)  Cozaar 100 Mg Tabs (Losartan Potassium) .... Take 1 Tablet By Mouth Once A Day 4)  Vitamin D3 1000 Unit  Tabs (Cholecalciferol) .Marland Kitchen.. 1 Qd 5)  Vitamin B-12 1000 Mcg  Tabs (Cyanocobalamin) .Marland Kitchen.. 1 Qd 6)  Loratadine 10 Mg  Tabs (Loratadine) .... Once Daily As Needed Allergies 7)  Veramyst 27.5 Mcg/spray Susp (Fluticasone Furoate) .... 2 Puffs Each Nostril  Q. Day 8)  Triamcinolone Acetonide 0.1 % Oint (Triamcinolone Acetonide) .... Use Bid 9)  Acidophilus  Caps (Lactobacillus) .... As Needed 10)  Triamcinolone 0.5% Cream in Eucerin Cream 1:3 .... Use Two Times A Day Prn 11)  Prednisone 10 Mg Tabs (Prednisone) .... Take 40mg  Qd For 3 Days, Then 20 Mg Qd For 3 Days, Then 10mg  Qd For 6 Days, Then Stop. Take Pc. 12)  Triamcinolone Acetonide 0.5 % Oint (Triamcinolone Acetonide) .... Use Two Times A Day As Needed On Rash  Allergies: 1)  ! 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   The patient denies fever, dyspnea on exertion, and abdominal pain.    Physical Exam  General:  overweight-appearing.   Nose:  Erythematous throat mucosa and intranasal erythema.  Mouth:  Oral mucosa and oropharynx without lesions or exudates.  Teeth in good repair. Neck:  No deformities, masses, or tenderness noted. Lungs:  normal respiratory effort, no intercostal retractions, no accessory muscle use, normal breath sounds, no dullness, and no fremitus.   Heart:  normal rate, regular rhythm, no murmur, no gallop, and no rub.   Abdomen:  soft, non-tender, normal bowel sounds, no hepatomegaly, and no splenomegaly.   Msk:  Lumbar-sacral spine is tender to palpation over paraspinal muscles and painfull with the ROM  Skin:  More extensive psoriasis rash all over. Hair thinned some Psych:  Oriented X3, memory intact for recent and remote, normally interactive, good eye contact, not anxious appearing, not depressed appearing, and not agitated.     Impression & Recommendations:  Problem # 1:  PSORIASIS NEC (ICD-696.1) Assessment Deteriorated Food additives allergy info given Take 40mg  qd for 3 days, then 20 mg qd for 3 days, then 10mg  qd for 6 days, then 5 mg by mouth once daily pc  Problem # 2:  HIP PAIN (ICD-719.45) Assessment: Improved  Her updated medication list for this problem includes:    Vicodin 5-500 Mg Tabs (Hydrocodone-acetaminophen) .Marland Kitchen... 1-2 tab two times a day  as needed pain  Problem # 3:  ALOPECIA (ICD-704.00) Assessment: Unchanged  Problem # 4:  HYPERTENSION (ICD-401.9) Assessment: Unchanged  Her updated medication list for this problem includes:    Cozaar 100 Mg Tabs (Losartan potassium) .Marland Kitchen... Take 1 tablet by mouth once a day  Complete Medication List: 1)  Temazepam 15 Mg Caps (Temazepam) .Marland Kitchen.. 1-2 prn insomnia 2)  Vicodin 5-500 Mg Tabs (Hydrocodone-acetaminophen) .Marland Kitchen.. 1-2 tab two times a day as needed pain 3)  Cozaar 100 Mg Tabs (Losartan potassium) .... Take 1  tablet by mouth once a day 4)  Vitamin D3 1000 Unit Tabs (Cholecalciferol) .Marland Kitchen.. 1 qd 5)  Vitamin B-12 1000 Mcg Tabs (Cyanocobalamin) .Marland Kitchen.. 1 qd 6)  Loratadine 10 Mg Tabs (Loratadine) .... Once daily as needed allergies 7)  Veramyst 27.5 Mcg/spray Susp (Fluticasone furoate) .... 2 puffs each nostril  q. day 8)  Triamcinolone Acetonide 0.1 % Oint (Triamcinolone acetonide) .... Use bid 9)  Acidophilus Caps (Lactobacillus) .... As needed 10)  Triamcinolone 0.5% Cream in Eucerin Cream 1:3  .... Use two times a day prn 11)  Prednisone 10 Mg Tabs (Prednisone) .... Take 40mg  qd for 3 days, then 20 mg qd for 3 days, then 10mg  qd for 6 days, then stop. take pc. 12)  Triamcinolone Acetonide 0.5 % Oint (Triamcinolone acetonide) .... Use two times a day as needed on rash 13)  Dovonex 0.005 % Crea (Calcipotriene) .... Use bid 14)  Prednisone 10 Mg Tabs (Prednisone) .... Take 40mg  qd for 3 days, then 20 mg qd for 3 days, then 10mg  qd for 6 days, then stop. take pc. 15)  Prednisone 5 Mg Tabs (Prednisone) .Marland Kitchen.. 1 by mouth once daily pc  Patient Instructions: 1)  Please schedule a follow-up appointment in 1 month. 2)  Try to avoid sulfites Prescriptions: PREDNISONE 5 MG TABS (PREDNISONE) 1 by mouth once daily pc  #30 x 3   Entered and Authorized by:   Tresa Garter MD   Signed by:   Tresa Garter MD on 07/27/2009   Method used:   Electronically to        [facility] [phone]* (retail)       [address]       [place], [place]  [address]       Ph: [phone]       Fax: [phone]   RxID:   [id number] PREDNISONE 10 MG TABS (PREDNISONE) Take 40mg  qd for 3 days, then 20 mg qd for 3 days, then 10mg  qd for 6 days, then stop. Take pc.  #24 x 1   Entered and Authorized by:   Tresa Garter MD   Signed by:   Tresa Garter MD on 07/27/2009   Method used:   Electronically to        [facility] [phone]* (retail)       [address]       [place], [place]  [address]       Ph:  [phone]       Fax: [phone]   RxID:   [id number] DOVONEX 0.005 % CREA (CALCIPOTRIENE) use bid  #120 g x 3   Entered and Authorized by:   Tresa Garter MD   Signed by:   Tresa Garter MD on 07/27/2009   Method used:   Electronically to        [facility] [phone]* (retail)       [address]  [place], [place]  [address]       Ph: [phone]       Fax: [phone]   RxID:   [id number]

## 2010-07-07 NOTE — Progress Notes (Signed)
Summary: REFILL  Phone Note Refill Request Message from:  Pharmacy  Refills Requested: Medication #1:  VICODIN 5-500 MG TABS 1-2 tab two times a day as needed pain Initial call taken by: Lamar Sprinkles, CMA,  December 08, 2009 6:16 PM  Follow-up for Phone Call        ok 2 ref Follow-up by: Tresa Garter MD,  December 09, 2009 12:52 PM    Prescriptions: VICODIN 5-500 MG TABS (HYDROCODONE-ACETAMINOPHEN) 1-2 tab two times a day as needed pain  #120 x 2   Entered by:   Lucious Groves   Authorized by:   Tresa Garter MD   Signed by:   Lucious Groves on 12/09/2009   Method used:   Telephoned to ...       [facility] (retail)       [address]       [place], [place]  [address]       Ph: [phone]       Fax: [phone]   RxID:   [id number]

## 2010-07-07 NOTE — Assessment & Plan Note (Signed)
Summary: FU ON THYROID/ SWELLING/NWS  PER SARAH   Vital Signs:  Patient profile:   60 year old female Height:      63 inches Weight:      273 pounds BMI:     48.53 O2 Sat:      96 % on Room air Temp:     97.9 degrees F oral Pulse rate:   94 / minute BP sitting:   140 / 62  (left arm) Cuff size:   large  Vitals Entered By: Lucious Groves (Oct 20, 2009 3:47 PM)  O2 Flow:  Room air CC: F/u--discuss thyroid./kb Is Patient Diabetic? No Pain Assessment Patient in pain? no      Comments Patient notes that soon she will begin Enbrel./kb   CC:  F/u--discuss thyroid./kb.  History of Present Illness: C/o rash flare up, leg swelling (working a lot) F/u fatigue - better F/u hypothyroidism  Current Medications (verified): 1)  Temazepam 15 Mg Caps (Temazepam) .Marland Kitchen.. 1-2 Prn Insomnia 2)  Vicodin 5-500 Mg Tabs (Hydrocodone-Acetaminophen) .Marland Kitchen.. 1-2 Tab Two Times A Day As Needed Pain 3)  Cozaar 100 Mg Tabs (Losartan Potassium) .... Take 1 Tablet By Mouth Once A Day 4)  Vitamin D3 1000 Unit  Tabs (Cholecalciferol) .Marland Kitchen.. 1 Qd 5)  Vitamin B-12 1000 Mcg  Tabs (Cyanocobalamin) .Marland Kitchen.. 1 Qd 6)  Loratadine 10 Mg  Tabs (Loratadine) .... Once Daily As Needed Allergies 7)  Veramyst 27.5 Mcg/spray Susp (Fluticasone Furoate) .... 2 Puffs Each Nostril  Q. Day 8)  Triamcinolone Acetonide 0.1 % Oint (Triamcinolone Acetonide) .... Use Bid 9)  Acidophilus  Caps (Lactobacillus) .... As Needed 10)  Triamcinolone 0.5% Cream in Eucerin Cream 1:3 .... Use Two Times A Day Prn 11)  Triamcinolone Acetonide 0.5 % Oint (Triamcinolone Acetonide) .... Use Two Times A Day As Needed On Rash 12)  Dovonex 0.005 % Crea (Calcipotriene) .... Use Bid 13)  Synthroid 25 Mcg Tabs (Levothyroxine Sodium) .Marland Kitchen.. 1 By Mouth Once Daily For Thyroid  Allergies (verified): 1)  ! Pcn 2)  Maxzide 3)  Enalapril Maleate 4)  Ibuprofen 5)  Spironolactone  Past History:  Past Surgical History: Last updated: 02/25/2008 Denies surgical  history  Family History: Last updated: 05/24/2007 Family History of Arthritis Family History Hypertension  Social History: Last updated: 05/24/2007 Occupation: office Married Never Smoked  Past Medical History: Psoriasis Dr Yetta Barre Psoriatic arthritis Dr Titus Dubin Depression Hypertension Low back pain Osteoarthritis Osteopenia Obesity Allergic rhinitis Low PLT count  Review of Systems       The patient complains of depression.  The patient denies chest pain and dyspnea on exertion.    Physical Exam  General:  overweight-appearing.   Nose:  Erythematous throat mucosa and intranasal erythema.  Mouth:  Oral mucosa and oropharynx without lesions or exudates.  Teeth in good repair. Neck:  No deformities, masses, or tenderness noted. Lungs:  normal respiratory effort, no intercostal retractions, no accessory muscle use, normal breath sounds, no dullness, and no fremitus.   Heart:  normal rate, regular rhythm, no murmur, no gallop, and no rub.   Abdomen:  soft, non-tender, normal bowel sounds, no hepatomegaly, and no splenomegaly.   Msk:  Lumbar-sacral spine is tender to palpation over paraspinal muscles and painfull with the ROM  Extremities:  1+ left pedal edema and 1+ right pedal edema.   Neurologic:  No cranial nerve deficits noted. Station and gait are normal. Plantar reflexes are down-going bilaterally. DTRs are symmetrical throughout. Sensory, motor and coordinative functions appear intact.  Skin:  More extensive psoriasis rash all over. Hair thinned some Psych:  Oriented X3, memory intact for recent and remote, normally interactive, good eye contact, not anxious appearing, not depressed appearing, and not agitated.     Impression & Recommendations:  Problem # 1:  PSORIASIS NEC (ICD-696.1) Assessment Deteriorated Take 40mg  qd for 3 days, then 20 mg qd for 3 days, then 10mg  qd for 6 days, then stop. Take pc. Elocone oint. Startng Enbrel in June  Problem # 2:   HYPOTHYROIDISM (ICD-244.9) Assessment: Improved  The following medications were removed from the medication list:    Synthroid 25 Mcg Tabs (Levothyroxine sodium) .Marland Kitchen... 1 by mouth once daily for thyroid Her updated medication list for this problem includes:    Synthroid 50 Mcg Tabs (Levothyroxine sodium) .Marland Kitchen... 1 by mouth once daily for thyroid  Labs Reviewed: TSH: 4.04 (10/18/2009)    HgBA1c: 5.0 (02/01/2009) Chol: 168 (12/10/2007)   HDL: 39.7 (12/10/2007)   LDL: 110 (12/10/2007)   TG: 91 (12/10/2007)  Problem # 3:  EDEMA (ICD-782.3) Assessment: Deteriorated  Her updated medication list for this problem includes:    Furosemide 40 Mg Tabs (Furosemide) .Marland Kitchen... 1 by mouth q am ( a water pill)  Problem # 4:  OBESITY, MORBID (ICD-278.01) Assessment: Deteriorated On diet  Complete Medication List: 1)  Temazepam 15 Mg Caps (Temazepam) .Marland Kitchen.. 1-2 prn insomnia 2)  Vicodin 5-500 Mg Tabs (Hydrocodone-acetaminophen) .Marland Kitchen.. 1-2 tab two times a day as needed pain 3)  Cozaar 100 Mg Tabs (Losartan potassium) .... Take 1 tablet by mouth once a day 4)  Vitamin D3 1000 Unit Tabs (Cholecalciferol) .Marland Kitchen.. 1 qd 5)  Vitamin B-12 1000 Mcg Tabs (Cyanocobalamin) .Marland Kitchen.. 1 qd 6)  Loratadine 10 Mg Tabs (Loratadine) .... Once daily as needed allergies 7)  Veramyst 27.5 Mcg/spray Susp (Fluticasone furoate) .... 2 puffs each nostril  q. day 8)  Triamcinolone Acetonide 0.1 % Oint (Triamcinolone acetonide) .... Use bid 9)  Acidophilus Caps (Lactobacillus) .... As needed 10)  Triamcinolone 0.5% Cream in Eucerin Cream 1:3  .... Use two times a day prn 11)  Dovonex 0.005 % Crea (Calcipotriene) .... Use bid 12)  Mometasone Furoate 0.1 % Oint (Mometasone furoate) .... Use two times a day - three times a day prn 13)  Synthroid 50 Mcg Tabs (Levothyroxine sodium) .Marland Kitchen.. 1 by mouth once daily for thyroid 14)  Furosemide 40 Mg Tabs (Furosemide) .Marland Kitchen.. 1 by mouth q am ( a water pill) 15)  Klor-con M10 10 Meq Cr-tabs (Potassium chloride  crys cr) .Marland Kitchen.. 1 by mouth qd  Patient Instructions: 1)  Please schedule a follow-up appointment in 3 months. 2)  BMP prior to visit, ICD-9: 3)  TSH prior to visit, ICD-9: 244.8 Prescriptions: KLOR-CON M10 10 MEQ CR-TABS (POTASSIUM CHLORIDE CRYS CR) 1 by mouth qd  #30 x 6   Entered and Authorized by:   Tresa Garter MD   Signed by:   Tresa Garter MD on 10/20/2009   Method used:   Electronically to        [facility] [phone]* (retail)       [address]       [place], [place]  [address]       Ph: [phone]       Fax: [phone]   RxID:   [id number] FUROSEMIDE 40 MG TABS (FUROSEMIDE) 1 by mouth q am ( a water pill)  #30 x 6   Entered and Authorized by:   Georgina Quint  Plotnikov MD   Signed by:   Tresa Garter MD on 10/20/2009   Method used:   Electronically to        [facility] [phone]* (retail)       [address]       [place], [place]  [address]       Ph: [phone]       Fax: [phone]   RxID:   [id number] SYNTHROID 50 MCG TABS (LEVOTHYROXINE SODIUM) 1 by mouth once daily for thyroid  #30 x 12   Entered and Authorized by:   Tresa Garter MD   Signed by:   Tresa Garter MD on 10/20/2009   Method used:   Electronically to        [facility] [phone]* (retail)       [address]       [place], [place]  [address]       Ph: [phone]       Fax: [phone]   RxID:   [id number] MOMETASONE FUROATE 0.1 % OINT (MOMETASONE FUROATE) use two times a day - three times a day prn  #120 g x 6   Entered and Authorized by:   Tresa Garter MD   Signed by:   Tresa Garter MD on 10/20/2009   Method used:   Electronically to        [facility] [phone]* (retail)       [address]       [place], [place]  [address]       Ph: [phone]       Fax: [phone]   RxID:   [id number]

## 2010-07-07 NOTE — Letter (Signed)
[facility]   Imported By: Sherian Rein 12/08/2009 11:35:06  _____________________________________________________________________  External Attachment:    Type:   Image     Comment:   External Document

## 2010-07-07 NOTE — Letter (Signed)
[facility]   Imported By: Sherian Rein 12/13/2009 12:13:05  _____________________________________________________________________  External Attachment:    Type:   Image     Comment:   External Document

## 2010-07-07 NOTE — Progress Notes (Signed)
Summary: Rf Vicodin  Phone Note Refill Request Message from:  Pharmacy  Refills Requested: Medication #1:  VICODIN 5-500 MG TABS 1-2 tab two times a day as needed pain   Dosage confirmed as above?Dosage Confirmed   Supply Requested: 120 ***[facility]   Method Requested: Telephone to Pharmacy Next Appointment Scheduled: 06-02-10 Initial call taken by: Lanier Prude, [facility]),  March 31, 2010 3:03 PM  Follow-up for Phone Call        ok to ref Follow-up by: Tresa Garter MD,  April 01, 2010 10:15 AM  Additional Follow-up for Phone Call Additional follow up Details #1::        Rx called to pharmacy Additional Follow-up by: Lanier Prude, [facility]),  April 01, 2010 11:28 AM    Prescriptions: VICODIN 5-500 MG TABS (HYDROCODONE-ACETAMINOPHEN) 1-2 tab two times a day as needed pain  #120 x 0   Entered by:   Lanier Prude, CMA(AAMA)   Authorized by:   Tresa Garter MD   Signed by:   Lanier Prude, CMA(AAMA) on 04/01/2010   Method used:   Telephoned to ...       [facility]  [address] [phone]* (retail)       [address]       [place], [place]  [address]       Ph: [phone]       Fax: [phone]   RxID:   [id number]

## 2010-07-07 NOTE — Letter (Signed)
[facility]   Imported By: Lester Sedan 12/23/2009 08:03:12  _____________________________________________________________________  External Attachment:    Type:   Image     Comment:   External Document

## 2010-07-07 NOTE — Progress Notes (Signed)
Summary: REQ FOR ABX  Phone Note Call from Patient Call back at Work Phone [phone] Call back at ext [phone]   Caller: OR CELL [phone] Summary of Call: Pt c/o sinus congestion/pressure w/green mucus. Patient is requesting rx for antibiotic for what she thinks is a sinus infection.  Initial call taken by: Lamar Sprinkles, CMA,  March 28, 2010 4:42 PM  Follow-up for Phone Call        ok zpac OV if not better Follow-up by: Tresa Garter MD,  March 28, 2010 5:17 PM  Additional Follow-up for Phone Call Additional follow up Details #1::        Pt informed  Additional Follow-up by: Lamar Sprinkles, CMA,  March 28, 2010 6:16 PM    New/Updated Medications: ZITHROMAX Z-PAK 250 MG TABS (AZITHROMYCIN) as dirrected Prescriptions: ZITHROMAX Z-PAK 250 MG TABS (AZITHROMYCIN) as dirrected  #1 x 0   Entered and Authorized by:   Tresa Garter MD   Signed by:   Lamar Sprinkles, CMA on 03/28/2010   Method used:   Electronically to        [facility] (retail)       [address]       [place], [place]  [address]       Ph: [phone]       Fax: [phone]   RxID:   [id number]

## 2010-07-07 NOTE — Progress Notes (Signed)
Summary: sinus infection  Phone Note Call from Patient Call back at Home Phone [phone]   Caller: Patient Reason for Call: Acute Illness Summary of Call: Patient called c/o sinus infection which consist of  congestion/drainage, sore throat, and cough w/ green mucuos. Patient would like to if MD will give a zpak or if she will need appt. Please advis Thanks.Alvy Beal Archie CMA  June 10, 2010 8:31 AM   Follow-up for Phone Call        ok zpac Follow-up by: Tresa Garter MD,  June 10, 2010 1:07 PM  Additional Follow-up for Phone Call Additional follow up Details #1::        Pt advised Additional Follow-up by: Margaret Pyle, CMA,  June 10, 2010 2:07 PM    New/Updated Medications: ZITHROMAX Z-PAK 250 MG TABS (AZITHROMYCIN) as dirrected Prescriptions: ZITHROMAX Z-PAK 250 MG TABS (AZITHROMYCIN) as dirrected  #1 x 0   Entered and Authorized by:   Tresa Garter MD   Signed by:   Margaret Pyle, CMA on 06/10/2010   Method used:   Electronically to        [facility] (retail)       [address]       [place], [place]  [address]       Ph: [phone]       Fax: [phone]   RxID:   [id number]

## 2010-07-07 NOTE — Letter (Signed)
Summary: [facility]   Imported By: Lennie Odor 02/15/2010 11:42:51  _____________________________________________________________________  External Attachment:    Type:   Image     Comment:   External Document

## 2010-07-07 NOTE — Assessment & Plan Note (Signed)
Summary: 2nd twinrix/plot/cd  Nurse Visit   Allergies: 1)  ! Pcn 2)  Maxzide 3)  Enalapril Maleate 4)  Ibuprofen 5)  Spironolactone  Immunizations Administered:  TwinRix # 2:    Vaccine Type: TwinRix    Site: right deltoid    Mfr: GlaxoSmithKline    Dose: 1.0 ml    Route: IM    Given by: Ami Bullins CMA    Exp. Date: 04/07/2012    Lot #: [id number]    VIS given: 02/21/07 version given June 30, 2010.  Orders Added: 1)  TwinRix 1ml ( Hep A&B Adult dose) [90636] 2)  Admin 1st Vaccine [90471]

## 2010-07-07 NOTE — Letter (Signed)
Summary: [facility]  [facility]   Imported By: Sherian Rein 04/25/2010 14:17:35  _____________________________________________________________________  External Attachment:    Type:   Image     Comment:   External Document

## 2010-07-07 NOTE — Progress Notes (Signed)
Summary: thyroid issue  Phone Note Call from Patient Call back at Work Phone [phone] Call back at [phone]   Caller: [phone] Summary of Call: Patient left message on triage that another MD did labs on her and her Thyroid was 6.148. Patient said that the results would be faxed and that the office suggested she call our office for recommendations. Please advise. Initial call taken by: Lucious Groves,  September 08, 2009 12:01 PM  Follow-up for Phone Call        Noted - Start Synthroid 25 once daily TSH in 6 wks 244.8 Follow-up by: Tresa Garter MD,  September 08, 2009 5:21 PM  Additional Follow-up for Phone Call Additional follow up Details #1::        patient notified. Additional Follow-up by: Lucious Groves,  September 09, 2009 11:06 AM  New Problems: HYPOTHYROIDISM (ICD-244.9)   New Problems: HYPOTHYROIDISM (ICD-244.9) New/Updated Medications: SYNTHROID 25 MCG TABS (LEVOTHYROXINE SODIUM) 1 by mouth once daily for thyroid Prescriptions: SYNTHROID 25 MCG TABS (LEVOTHYROXINE SODIUM) 1 by mouth once daily for thyroid  #90 x 3   Entered and Authorized by:   Tresa Garter MD   Signed by:   Rock Nephew CMA on 09/09/2009   Method used:   Electronically to        [facility] [phone]* (retail)       [address]       [place], [place]  [address]       Ph: [phone]       Fax: [phone]   RxID:   [id number]

## 2010-07-07 NOTE — Assessment & Plan Note (Signed)
Summary: CPX /NWS #   Vital Signs:  Patient profile:   60 year old female Height:      63 inches Weight:      271 pounds BMI:     48.18 Temp:     98.3 degrees F oral Pulse rate:   72 / minute Pulse rhythm:   regular Resp:     16 per minute BP sitting:   120 / 76  (left arm) Cuff size:   large  Vitals Entered By: Lanier Prude, Beverly Gust) (June 02, 2010 10:34 AM) CC: CPX Is Patient Diabetic? No   CC:  CPX.  History of Present Illness: The patient presents for a preventive health examination   Current Medications (verified): 1)  Temazepam 15 Mg Caps (Temazepam) .Marland Kitchen.. 1-2 Prn Insomnia 2)  Vicodin 5-500 Mg Tabs (Hydrocodone-Acetaminophen) .Marland Kitchen.. 1-2 Tab Two Times A Day As Needed Pain 3)  Cozaar 100 Mg Tabs (Losartan Potassium) .... Take 1 Tablet By Mouth Once A Day 4)  Vitamin D3 1000 Unit  Tabs (Cholecalciferol) .Marland Kitchen.. 1 Qd 5)  Vitamin B-12 1000 Mcg  Tabs (Cyanocobalamin) .Marland Kitchen.. 1 Qd 6)  Loratadine 10 Mg  Tabs (Loratadine) .... Once Daily As Needed Allergies 7)  Veramyst 27.5 Mcg/spray Susp (Fluticasone Furoate) .... 2 Puffs Each Nostril  Q. Day 8)  Triamcinolone Acetonide 0.1 % Oint (Triamcinolone Acetonide) .... Use Bid 9)  Acidophilus  Caps (Lactobacillus) .... As Needed 10)  Triamcinolone 0.5% Cream in Eucerin Cream 1:3 .... Use Two Times A Day Prn 11)  Dovonex 0.005 % Crea (Calcipotriene) .... Use Bid 12)  Mometasone Furoate 0.1 % Oint (Mometasone Furoate) .... Use Two Times A Day - Three Times A Day Prn 13)  Synthroid 50 Mcg Tabs (Levothyroxine Sodium) .Marland Kitchen.. 1 By Mouth Once Daily For Thyroid 14)  Furosemide 40 Mg Tabs (Furosemide) .Marland Kitchen.. 1 By Mouth Q Am ( A Water Pill) 15)  Klor-Con M10 10 Meq Cr-Tabs (Potassium Chloride Crys Cr) .Marland Kitchen.. 1 By Mouth Qd  Allergies (verified): 1)  ! Pcn 2)  Maxzide 3)  Enalapril Maleate 4)  Ibuprofen 5)  Spironolactone  Past History:  Past Surgical History: Last updated: 02/25/2008 Denies surgical history  Family History: Last updated:  05/24/2007 Family History of Arthritis Family History Hypertension  Past Medical History: Psoriasis Dr Yetta Barre Psoriatic arthritis Dr Titus Dubin Depression Hypertension Low back pain Osteoarthritis Osteopenia Obesity Allergic rhinitis Low PLT count - ITP 2011 Dr Gaylyn Rong Hepatomegaly - liver cirrhosis - NASH - 2011 Dr Julieta Gutting [facility] - she had an MRI  Review of Systems       The patient complains of weight gain, suspicious skin lesions, and depression.  The patient denies anorexia, fever, weight loss, vision loss, decreased hearing, hoarseness, chest pain, syncope, dyspnea on exertion, peripheral edema, prolonged cough, headaches, hemoptysis, abdominal pain, melena, hematochezia, severe indigestion/heartburn, hematuria, incontinence, genital sores, muscle weakness, transient blindness, difficulty walking, unusual weight change, abnormal bleeding, enlarged lymph nodes, angioedema, and breast masses.    Physical Exam  General:  NAD, overweight-appearing.   Head:  Normocephalic and atraumatic without obvious abnormalities. Eyes:  No corneal or conjunctival inflammation noted. EOMI. Perrla. Ears:  External ear exam shows rash.  Otoscopic examination reveals scaly canals, tympanic membranes are intact bilaterally without bulging, retraction, inflammation or discharge. Hearing is grossly normal bilaterally. Nose:  WNL Mouth:  Oral mucosa and oropharynx without lesions or exudates.  Teeth in good repair. Neck:  No deformities, masses, or tenderness noted. Lungs:  normal respiratory effort, no intercostal  retractions, no accessory muscle use, normal breath sounds, no dullness, and no fremitus.   Heart:  normal rate, regular rhythm, no murmur, no gallop, and no rub.   Abdomen:  soft, non-tender, normal bowel sounds, no hepatomegaly, and no splenomegaly.   Msk:  Lumbar-sacral spine is tender to palpation over paraspinal muscles and painfull with the ROM  Pulses:  B slight  decrease Extremities:  trace left pedal edema and trace right pedal edema.   Neurologic:  No cranial nerve deficits noted. Station and gait are normal. Plantar reflexes are down-going bilaterally. DTRs are symmetrical throughout. Sensory, motor and coordinative functions appear intact. Skin:   extensive psoriasis rash all over. Hair thinned some Psych:  Oriented X3, memory intact for recent and remote, normally interactive, good eye contact, not anxious appearing, not depressed appearing, and not agitated.     Impression & Recommendations:  Problem # 1:  HEALTH MAINTENANCE EXAM (ICD-V70.0) Assessment New Health and age related issues were discussed. Available screening tests and vaccinations were discussed as well. Healthy life style including good diet and exercise was discussed. Labs ordered. GYN and mammo q42mo. Ophth q 1-2 years. Colon up to date Orders: TLB-BMP (Basic Metabolic Panel-BMET) (80048-METABOL) TLB-CBC Platelet - w/Differential (85025-CBCD) TLB-Hepatic/Liver Function Pnl (80076-HEPATIC) TLB-Lipid Panel (80061-LIPID) TLB-TSH (Thyroid Stimulating Hormone) (84443-TSH) TLB-Udip ONLY (81003-UDIP)  Problem # 2:  HYPERTENSION (ICD-401.9) Assessment: Unchanged  Her updated medication list for this problem includes:    Cozaar 100 Mg Tabs (Losartan potassium) .Marland Kitchen... Take 1 tablet by mouth once a day    Furosemide 40 Mg Tabs (Furosemide) .Marland Kitchen... 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 1-2 tab two times a day as needed pain    Hydrocodone-acetaminophen 5-325 Mg Tabs (Hydrocodone-acetaminophen) .Marland Kitchen... 1-2 by mouth two times a day as needed pain Her updated medication list for this problem includes: Rx destroyed    Vicoprofen 7.5-200 Mg Tabs (Hydrocodone-ibuprofen) .Marland Kitchen... 1 by mouth up to qid as needed pain  Problem # 6:  LOW BACK PAIN (ICD-724.2) Assessment: Unchanged  The following medications were removed from the medication list:    Vicodin 5-500 Mg Tabs (Hydrocodone-acetaminophen) .Marland Kitchen... 1-2 tab two times a day as needed pain    Hydrocodone-acetaminophen 5-325 Mg Tabs (Hydrocodone-acetaminophen) .Marland Kitchen... 1-2 by mouth two times a day as needed pain Her updated medication list for this problem includes:    Vicoprofen 7.5-200 Mg Tabs (Hydrocodone-ibuprofen) .Marland Kitchen... 1 by mouth up to qid as needed pain  Problem # 7:  OBESITY, MORBID (ICD-278.01) Assessment: Deteriorated Discussed lap band  Complete Medication List: 1)  Temazepam 15 Mg Caps (Temazepam) .Marland Kitchen.. 1-2 prn insomnia 2)  Cozaar 100 Mg Tabs (Losartan potassium) .... Take 1 tablet by mouth once a day 3)  Vitamin D3 1000 Unit Tabs (Cholecalciferol) .Marland Kitchen.. 1 qd 4)  Vitamin B-12 1000 Mcg Tabs (Cyanocobalamin) .Marland Kitchen.. 1 qd 5)  Loratadine 10 Mg Tabs (Loratadine) .... Once daily as needed allergies 6)  Veramyst  27.5 Mcg/spray Susp (Fluticasone furoate) .... 2 puffs each nostril  q. day 7)  Triamcinolone Acetonide 0.1 % Oint (Triamcinolone acetonide) .... Use bid 8)  Acidophilus Caps (Lactobacillus) .... As needed 9)  Triamcinolone 0.5% Cream in Eucerin Cream 1:3  .... Use two times a day prn 10)  Dovonex 0.005 % Crea (Calcipotriene) .... Use bid 11)  Mometasone Furoate 0.1 % Oint (Mometasone furoate) .... Use two times a day - three times a day prn 12)  Synthroid 50 Mcg Tabs (Levothyroxine sodium) .Marland Kitchen.. 1 by mouth once daily  for thyroid 13)  Furosemide 40 Mg Tabs (Furosemide) .Marland Kitchen.. 1 by mouth q am ( a water pill) 14)  Klor-con M10 10 Meq Cr-tabs (Potassium chloride crys cr) .Marland Kitchen.. 1 by mouth qd 15)  Vicoprofen 7.5-200 Mg Tabs (Hydrocodone-ibuprofen) .Marland Kitchen.. 1 by mouth up to qid as needed pain  Other Orders: TwinRix 1ml ( Hep A&B Adult dose) (04540) Admin 1st Vaccine (98119)  Patient Instructions: 1)  Please schedule a follow-up appointment in 4 months. Prescriptions: VICOPROFEN 7.5-200 MG TABS (HYDROCODONE-IBUPROFEN) 1 by mouth up to qid as needed pain  #120 x 2   Entered and Authorized by:   Tresa Garter MD   Signed by:   Tresa Garter MD on 06/02/2010   Method used:   Print then Give to Patient   RxID:   [id number] HYDROCODONE-ACETAMINOPHEN 5-325 MG TABS (HYDROCODONE-ACETAMINOPHEN) 1-2 by mouth two times a day as needed pain  #120 x 2   Entered and Authorized by:   Tresa Garter MD   Signed by:   Tresa Garter MD on 06/02/2010   Method used:   Print then Give to Patient   RxID:   [id number]    Orders Added: 1)  TLB-BMP (Basic Metabolic Panel-BMET) [80048-METABOL] 2)  TLB-CBC Platelet - w/Differential [85025-CBCD] 3)  TLB-Hepatic/Liver Function Pnl [80076-HEPATIC] 4)  TLB-Lipid Panel [80061-LIPID] 5)  TLB-TSH (Thyroid Stimulating Hormone) [84443-TSH] 6)  TLB-Udip ONLY [81003-UDIP] 7)  TLB-B12, Serum-Total ONLY [82607-B12] 8)  TwinRix 1ml ( Hep A&B Adult dose) [90636] 9)  Admin 1st Vaccine [90471] 10)  Est. Patient 40-64 years [99396]   Immunization History:  Influenza Immunization History:    Influenza:  historical (03/23/2010)  Immunizations Administered:  TwinRix # 1:    Vaccine Type: TwinRix    Site: right deltoid    Mfr: GlaxoSmithKline    Dose: 1.0 ml    Route: IM    Given by: Lanier Prude, CMA(AAMA)    Exp. Date: 04/07/2012    Lot #: [id number]    VIS given: 02/21/07 version given June 02, 2010.   Immunization History:  Influenza  Immunization History:    Influenza:  Historical (03/23/2010)  Immunizations Administered:  TwinRix # 1:    Vaccine Type: TwinRix    Site: right deltoid    Mfr: GlaxoSmithKline    Dose: 1.0 ml    Route: IM    Given by: Lanier Prude, CMA(AAMA)    Exp. Date: 04/07/2012    Lot #: [id number]    VIS given: 02/21/07 version given June 02, 2010.

## 2010-07-07 NOTE — Progress Notes (Signed)
Summary: Rf Vicodin  Phone Note Refill Request Message from:  Pharmacy  Refills Requested: Medication #1:  VICODIN 5-500 MG TABS 1-2 tab two times a day as needed pain   Dosage confirmed as above?Dosage Confirmed   Supply Requested: 120   Last Refilled: 04/01/2010  Method Requested: Telephone to Pharmacy Initial call taken by: Lanier Prude, [facility]),  May 09, 2010 5:10 PM  Follow-up for Phone Call        ok to ref  Additional Follow-up for Phone Call Additional follow up Details #1::        Rx called to pharmacy Additional Follow-up by: Lanier Prude, [facility]),  May 10, 2010 5:10 PM    Prescriptions: VICODIN 5-500 MG TABS (HYDROCODONE-ACETAMINOPHEN) 1-2 tab two times a day as needed pain  #120 x 0   Entered by:   Lanier Prude, CMA(AAMA)   Authorized by:   Tresa Garter MD   Signed by:   Lanier Prude, CMA(AAMA) on 05/10/2010   Method used:   Telephoned to ...       [facility]  [address] [phone]* (retail)       [address]       [place], [place]  [address]       Ph: [phone]       Fax: [phone]   RxID:   [id number]

## 2010-07-14 ENCOUNTER — Encounter: Payer: BC Managed Care – PPO | Admitting: Oncology

## 2010-07-14 DIAGNOSIS — D696 Thrombocytopenia, unspecified: Secondary | ICD-10-CM

## 2010-07-14 DIAGNOSIS — L4050 Arthropathic psoriasis, unspecified: Secondary | ICD-10-CM

## 2010-07-14 DIAGNOSIS — E876 Hypokalemia: Secondary | ICD-10-CM

## 2010-07-14 LAB — CBC WITH DIFFERENTIAL/PLATELET
Basophils Absolute: 0 10*3/uL (ref 0.0–0.1)
Eosinophils Absolute: 0.1 10*3/uL (ref 0.0–0.5)
HGB: 12.7 g/dL (ref 11.6–15.9)
MONO#: 0.2 10*3/uL (ref 0.1–0.9)
NEUT#: 1.7 10*3/uL (ref 1.5–6.5)
Platelets: 74 10*3/uL — ABNORMAL LOW (ref 145–400)
RBC: 4.27 10*6/uL (ref 3.70–5.45)
RDW: 14.4 % (ref 11.2–14.5)
WBC: 2.7 10*3/uL — ABNORMAL LOW (ref 3.9–10.3)

## 2010-07-22 ENCOUNTER — Telehealth: Payer: Self-pay | Admitting: Internal Medicine

## 2010-07-27 ENCOUNTER — Telehealth: Payer: Self-pay | Admitting: Internal Medicine

## 2010-08-02 NOTE — Progress Notes (Signed)
Summary: Med ?  Phone Note From Pharmacy   Caller: [facility] [address] Summary of Call: rec a fax stating pt is req a change from ibuprofen combo to APAP combo because it is hurting her stomach. She just filled this on 07/25/10. Please advise Initial call taken by: Lanier Prude, [facility]),  July 27, 2010 3:55 PM  Follow-up for Phone Call        ok Follow-up by: Tresa Garter MD,  July 28, 2010 7:52 AM    New/Updated Medications: HYDROCODONE-ACETAMINOPHEN 7.5-325 MG TABS (HYDROCODONE-ACETAMINOPHEN) 1 by mouth qid as needed pain Prescriptions: HYDROCODONE-ACETAMINOPHEN 7.5-325 MG TABS (HYDROCODONE-ACETAMINOPHEN) 1 by mouth qid as needed pain  #120 x 2   Entered by:   Margaret Pyle, CMA   Authorized by:   Tresa Garter MD   Signed by:   Margaret Pyle, CMA on 07/29/2010   Method used:   Printed then faxed to ...       [facility] (retail)       [address]       [place], [place]  [address]       Ph: [phone]       Fax: [phone]   RxID:   [id number]

## 2010-08-02 NOTE — Progress Notes (Signed)
Summary: RF  Phone Note Refill Request Message from:  Pharmacy  Refills Requested: Medication #1:  VICOPROFEN 7.5-200 MG TABS 1 by mouth up to qid as needed pain. [facility] [address]  Initial call taken by: Lamar Sprinkles, CMA,  July 22, 2010 5:51 PM  Follow-up for Phone Call        ok Follow-up by: Tresa Garter MD,  July 24, 2010 12:48 PM    Prescriptions: VICOPROFEN 7.5-200 MG TABS (HYDROCODONE-IBUPROFEN) 1 by mouth up to qid as needed pain  #120 x 2   Entered by:   Lamar Sprinkles, CMA   Authorized by:   Tresa Garter MD   Signed by:   Lamar Sprinkles, CMA on 07/25/2010   Method used:   Telephoned to ...       [facility] [id number]* (retail)       [address] [place], [place]  [address]       Ph: [phone]       Fax: [phone]   RxID:   [id number]

## 2010-08-03 ENCOUNTER — Telehealth: Payer: Self-pay | Admitting: Internal Medicine

## 2010-08-10 ENCOUNTER — Encounter: Payer: Self-pay | Admitting: Internal Medicine

## 2010-08-10 ENCOUNTER — Ambulatory Visit (INDEPENDENT_AMBULATORY_CARE_PROVIDER_SITE_OTHER): Payer: BC Managed Care – PPO | Admitting: Internal Medicine

## 2010-08-10 DIAGNOSIS — D693 Immune thrombocytopenic purpura: Secondary | ICD-10-CM

## 2010-08-10 DIAGNOSIS — M25579 Pain in unspecified ankle and joints of unspecified foot: Secondary | ICD-10-CM | POA: Insufficient documentation

## 2010-08-10 DIAGNOSIS — K7460 Unspecified cirrhosis of liver: Secondary | ICD-10-CM

## 2010-08-11 NOTE — Progress Notes (Signed)
Summary: HYDROCODONE - FYI  Phone Note From Pharmacy   Summary of Call: [facility] [address] called regarding hydrocodone request, see previous phone note. [facility]. [facility] and pt has already picked up rx. Advised [facility] of this and to cx any rx for hydrocodone for pt at that pharm.  Initial call taken by: Lamar Sprinkles, CMA,  August 03, 2010 12:19 PM  Follow-up for Phone Call        noted Thank you!  Follow-up by: Tresa Garter MD,  August 03, 2010 6:09 PM

## 2010-08-16 NOTE — Assessment & Plan Note (Signed)
Summary: DR AVP PT--NO CLINIC  -L LEG PAIN---STC   Vital Signs:  Patient profile:   60 year old female Weight:      98.3 pounds (44.68 kg) O2 Sat:      97 % on Room air Temp:     98.3 degrees F (36.83 degrees C) oral Pulse rate:   73 / minute BP sitting:   130 / 72  (left arm) Cuff size:   large  Vitals Entered By: Orlan Leavens RMA (August 10, 2010 2:52 PM)  O2 Flow:  Room air CC: (L) Leg Pain Is Patient Diabetic? No Pain Assessment Patient in pain? yes     Location: (L) Leg Type: aching Comments Requesting refill on triamcinolone cream   Primary Care Provider:  Tresa Garter MD  CC:  (L) Leg Pain.  History of Present Illness: c/o pain in distal left leg onset 3 days ago no change in chronic redness but mildly swollen and ?hot to touch no fever - no med changes no swelling in calf denies injury or truama - today not tender to touch- back to normal size  Clinical Review Panels:  CBC   WBC:  4.0 (06/02/2010)   RBC:  4.44 (06/02/2010)   Hgb:  13.3 (06/02/2010)   Hct:  39.8 (06/02/2010)   Platelets:  93.0 (06/02/2010)   MCV  89.6 (06/02/2010)   MCHC  33.5 (06/02/2010)   RDW  14.8 (06/02/2010)   PMN:  67.5 (06/02/2010)   Lymphs:  22.8 (06/02/2010)   Monos:  7.1 (06/02/2010)   Eosinophils:  2.1 (06/02/2010)   Basophil:  0.5 (06/02/2010)  Complete Metabolic Panel   Glucose:  90 (06/02/2010)   Sodium:  138 (06/02/2010)   Potassium:  4.1 (06/02/2010)   Chloride:  102 (06/02/2010)   CO2:  28 (06/02/2010)   BUN:  9 (06/02/2010)   Creatinine:  0.7 (06/02/2010)   Albumin:  3.6 (06/02/2010)   Total Protein:  7.0 (06/02/2010)   Calcium:  8.8 (06/02/2010)   Total Bili:  1.5 (06/02/2010)   Alk Phos:  96 (06/02/2010)   SGPT (ALT):  19 (06/02/2010)   SGOT (AST):  25 (06/02/2010)   Current Medications (verified): 1)  Temazepam 15 Mg Caps (Temazepam) .Marland Kitchen.. 1-2 Prn Insomnia 2)  Cozaar 100 Mg Tabs (Losartan Potassium) .... Take 1 Tablet By Mouth Once A Day 3)   Vitamin D3 1000 Unit  Tabs (Cholecalciferol) .Marland Kitchen.. 1 Qd 4)  Vitamin B-12 1000 Mcg  Tabs (Cyanocobalamin) .Marland Kitchen.. 1 Qd 5)  Loratadine 10 Mg  Tabs (Loratadine) .... Once Daily As Needed Allergies 6)  Veramyst 27.5 Mcg/spray Susp (Fluticasone Furoate) .... 2 Puffs Each Nostril  Q. Day 7)  Triamcinolone Acetonide 0.1 % Oint (Triamcinolone Acetonide) .... Use Bid 8)  Acidophilus  Caps (Lactobacillus) .... As Needed 9)  Synthroid 50 Mcg Tabs (Levothyroxine Sodium) .Marland Kitchen.. 1 By Mouth Once Daily For Thyroid 10)  Furosemide 40 Mg Tabs (Furosemide) .Marland Kitchen.. 1 By Mouth Q Am ( A Water Pill) 11)  Klor-Con M10 10 Meq Cr-Tabs (Potassium Chloride Crys Cr) .Marland Kitchen.. 1 By Mouth Qd 12)  Hydrocodone-Acetaminophen 7.5-325 Mg Tabs (Hydrocodone-Acetaminophen) .Marland Kitchen.. 1 By Mouth Qid As Needed Pain  Allergies (verified): 1)  ! Pcn 2)  Maxzide 3)  Enalapril Maleate 4)  Ibuprofen 5)  Spironolactone  Past History:  Past Medical History: Psoriasis Dr Yetta Barre  Psoriatic arthritis Dr Titus Dubin Depression Hypertension Low back pain Osteoarthritis Osteopenia Obesity Allergic rhinitis Low PLT count - ITP 2011 Dr Gaylyn Rong Hepatomegaly - liver cirrhosis -  NASH - 2011 Dr Julieta Gutting [facility] - she had an MRI  Review of Systems  The patient denies fever, weight loss, dyspnea on exertion, and difficulty walking.    Physical Exam  General:  overweight-appearing.  alert, well-developed, well-nourished, and cooperative to examination.   nontox Lungs:  normal respiratory effort, no intercostal retractions or use of accessory muscles; normal breath sounds bilaterally - no crackles and no wheezes.    Heart:  normal rate, regular rhythm, no murmur, and no rub. BLE with chronic 1+ edema. Msk:  L ankle FROM, no effusion or deformity - nontender Skin:   extensive psoriasis rash all over. 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 Gaylyn Rong) in emr reviewd today  Problem # 3:  CIRRHOSIS (ICD-571.5) follows at [facility] center for same  Complete Medication List: 1)  Temazepam 15 Mg Caps (Temazepam) .Marland Kitchen.. 1-2 prn insomnia 2)  Cozaar 100 Mg Tabs (Losartan potassium) .... Take 1 tablet by mouth once a day 3)  Vitamin D3 1000 Unit Tabs (Cholecalciferol) .Marland Kitchen.. 1 qd 4)  Vitamin B-12 1000 Mcg Tabs (Cyanocobalamin) .Marland Kitchen.. 1 qd 5)  Loratadine 10 Mg Tabs (Loratadine) .... Once daily as needed allergies 6)  Veramyst 27.5 Mcg/spray Susp (Fluticasone furoate) .... 2 puffs each nostril  q. day 7)  Triamcinolone Acetonide 0.1 % Oint (Triamcinolone acetonide) .... Use bid 8)  Acidophilus Caps (Lactobacillus) .... As needed 9)  Synthroid 50 Mcg Tabs (Levothyroxine sodium) .Marland Kitchen.. 1 by mouth once daily for thyroid 10)  Furosemide 40 Mg Tabs (Furosemide) .Marland Kitchen.. 1 by mouth q am ( a water pill) 11)  Klor-con M10 10 Meq Cr-tabs (Potassium chloride crys cr) .Marland Kitchen.. 1 by mouth qd 12)  Hydrocodone-acetaminophen 7.5-325 Mg Tabs (Hydrocodone-acetaminophen) .Marland Kitchen.. 1 by mouth qid as needed pain 13)  Neurontin 100 Mg Caps (Gabapentin) .Marland Kitchen.. 1 by mouth two times a day (or as directed) 14)  Doxycycline Hyclate 100 Mg Tabs (Doxycycline hyclate) .Marland Kitchen.. 1 by mouth two times a day x 7 days - rx expires 08/24/10  Patient Instructions: 1)  it was good to see you today. 2)  no evidence for infection but if increase warmth or swelling, ok to start antibiotoics  - your prescription has been given to you to submit to your pharmacy if needed. Please take as directed. Contact our office if you believe you're having problems  with the medication(s).  3)  try neurontoin for nerve pain as dicsussed - at [facility] - ok to use with other pain pills 4)  Please keep scheduled follow-up appointment with Dr. Posey Rea to review pain and other symptoms, call sooner if problems.  Prescriptions: DOXYCYCLINE HYCLATE 100 MG TABS (DOXYCYCLINE HYCLATE) 1 by mouth two times a day x 7 days - RX EXPIRES 08/24/10  #14 x 0   Entered and Authorized by:   Newt Lukes MD   Signed by:   Newt Lukes MD on 08/10/2010   Method used:   Print then Give to  Patient   RxID:   [id number] NEURONTIN 100 MG CAPS (GABAPENTIN) 1 by mouth two times a day (or as directed)  #60 x 0   Entered and Authorized by:   Newt Lukes MD   Signed by:   Newt Lukes MD on 08/10/2010   Method used:   Electronically to        [facility] [address] [id number]* (retail)       [address] [place]       [place], [place]  [address]       Ph: [phone]       Fax: [phone]   RxID:   [id number] TRIAMCINOLONE ACETONIDE 0.1 % OINT (TRIAMCINOLONE ACETONIDE) use bid  #1 x 1   Entered by:   Orlan Leavens RMA   Authorized by:   Newt Lukes MD   Signed by:   Orlan Leavens RMA on 08/10/2010   Method used:   Electronically to        [facility] [address] #[id number]* (retail)       [address]       [place], [place]  [address]       Ph: [phone]       Fax: [phone]   RxID:   [id number]    Orders Added: 1)  Est. Patient Level IV [[address]]

## 2010-08-21 LAB — CBC
Platelets: 103 10*3/uL — ABNORMAL LOW (ref 150–400)
RDW: 14.2 % (ref 11.5–15.5)
WBC: 4.4 10*3/uL (ref 4.0–10.5)

## 2010-08-21 LAB — BONE MARROW EXAM

## 2010-08-21 LAB — PROTIME-INR: Prothrombin Time: 15.5 seconds — ABNORMAL HIGH (ref 11.6–15.2)

## 2010-09-30 ENCOUNTER — Encounter: Payer: Self-pay | Admitting: Internal Medicine

## 2010-09-30 ENCOUNTER — Ambulatory Visit (INDEPENDENT_AMBULATORY_CARE_PROVIDER_SITE_OTHER): Payer: BC Managed Care – PPO | Admitting: Internal Medicine

## 2010-09-30 DIAGNOSIS — M545 Low back pain, unspecified: Secondary | ICD-10-CM

## 2010-09-30 DIAGNOSIS — M1990 Unspecified osteoarthritis, unspecified site: Secondary | ICD-10-CM

## 2010-09-30 DIAGNOSIS — L408 Other psoriasis: Secondary | ICD-10-CM

## 2010-09-30 NOTE — Assessment & Plan Note (Signed)
On Rx 

## 2010-09-30 NOTE — Assessment & Plan Note (Signed)
On rx 

## 2010-09-30 NOTE — Progress Notes (Signed)
  Subjective:    Patient ID: Lori Mills, female    DOB: Jul 09, 1950, 60 y.o.   MRN: [id number]  HPI   The patient is here to follow up on chronic psoriasis, depression, anxiety, headaches and chronic moderate fibromyalgia symptoms controlled with medicines, diet and exercise.   Review of Systems  Constitutional: Positive for fatigue. Negative for activity change and unexpected weight change.  HENT: Negative for nosebleeds.   Respiratory: Negative for shortness of breath.   Gastrointestinal: Negative for nausea.  Genitourinary: Negative for dysuria.  Neurological: Negative for tremors and seizures.  Psychiatric/Behavioral: The patient is not nervous/anxious.        Objective:   Physical Exam  Constitutional: She appears well-developed and well-nourished. No distress.  HENT:  Head: Normocephalic.  Right Ear: External ear normal.  Left Ear: External ear normal.  Nose: Nose normal.  Mouth/Throat: Oropharynx is clear and moist.  Eyes: Conjunctivae are normal. Pupils are equal, round, and reactive to light. Right eye exhibits no discharge. Left eye exhibits no discharge.  Neck: Normal range of motion. Neck supple. No JVD present. No tracheal deviation present. No thyromegaly present.  Cardiovascular: Normal rate, regular rhythm and normal heart sounds.   Pulmonary/Chest: No stridor. No respiratory distress. She has no wheezes.  Abdominal: Soft. Bowel sounds are normal. She exhibits no distension and no mass. There is no tenderness. There is no rebound and no guarding.  Musculoskeletal: She exhibits edema (trace B) and tenderness (LS and B ankles - tender).  Lymphadenopathy:    She has no cervical adenopathy.  Neurological: She displays normal reflexes. No cranial nerve deficit. She exhibits normal muscle tone. Coordination normal.  Skin: Rash (exensive psoriasis) noted. No erythema.  Psychiatric: She has a normal mood and affect. Her behavior is normal. Judgment and thought  content normal.          Assessment & Plan:  PSORIASIS NEC She is getting a light box  LOW BACK PAIN On rx  OBESITY, MORBID Wt Readings from Last 3 Encounters:  09/30/10 275 lb (124.739 kg)  08/10/10 98 lb 4.8 oz (44.589 kg)  06/02/10 271 lb (122.925 kg)  ???   OSTEOARTHRITIS On Rx   Liver disease  Seeing GI in [place]

## 2010-09-30 NOTE — Assessment & Plan Note (Signed)
Wt Readings from Last 3 Encounters:  09/30/10 275 lb (124.739 kg)  08/10/10 98 lb 4.8 oz (44.589 kg)  06/02/10 271 lb (122.925 kg)  ???

## 2010-09-30 NOTE — Assessment & Plan Note (Signed)
She is getting a light box

## 2010-10-07 ENCOUNTER — Other Ambulatory Visit: Payer: Self-pay | Admitting: Oncology

## 2010-10-07 ENCOUNTER — Encounter (HOSPITAL_BASED_OUTPATIENT_CLINIC_OR_DEPARTMENT_OTHER): Payer: BC Managed Care – PPO | Admitting: Oncology

## 2010-10-07 DIAGNOSIS — L4050 Arthropathic psoriasis, unspecified: Secondary | ICD-10-CM

## 2010-10-07 DIAGNOSIS — E876 Hypokalemia: Secondary | ICD-10-CM

## 2010-10-07 DIAGNOSIS — D696 Thrombocytopenia, unspecified: Secondary | ICD-10-CM

## 2010-10-07 LAB — LACTATE DEHYDROGENASE: LDH: 190 U/L (ref 94–250)

## 2010-10-07 LAB — CBC WITH DIFFERENTIAL/PLATELET
Basophils Absolute: 0 10*3/uL (ref 0.0–0.1)
EOS%: 2.8 % (ref 0.0–7.0)
HCT: 36.8 % (ref 34.8–46.6)
HGB: 12.6 g/dL (ref 11.6–15.9)
LYMPH%: 24.6 % (ref 14.0–49.7)
MCH: 30.2 pg (ref 25.1–34.0)
MCV: 88.7 fL (ref 79.5–101.0)
MONO%: 5.4 % (ref 0.0–14.0)
NEUT%: 66.3 % (ref 38.4–76.8)
Platelets: 80 10*3/uL — ABNORMAL LOW (ref 145–400)

## 2010-10-07 LAB — MORPHOLOGY: PLT EST: DECREASED

## 2010-10-07 LAB — COMPREHENSIVE METABOLIC PANEL
ALT: 17 U/L (ref 0–35)
AST: 30 U/L (ref 0–37)
CO2: 27 mEq/L (ref 19–32)
Calcium: 8.6 mg/dL (ref 8.4–10.5)
Chloride: 106 mEq/L (ref 96–112)
Sodium: 143 mEq/L (ref 135–145)
Total Bilirubin: 1 mg/dL (ref 0.3–1.2)
Total Protein: 6.6 g/dL (ref 6.0–8.3)

## 2010-11-03 ENCOUNTER — Other Ambulatory Visit: Payer: Self-pay | Admitting: *Deleted

## 2010-11-03 ENCOUNTER — Other Ambulatory Visit: Payer: Self-pay | Admitting: Internal Medicine

## 2010-11-03 MED ORDER — GABAPENTIN 100 MG PO CAPS: 100.0000 mg | ORAL_CAPSULE | Freq: Two times a day (BID) | ORAL | Status: DC

## 2010-11-03 NOTE — Telephone Encounter (Signed)
Ok to Rf? 

## 2010-11-04 ENCOUNTER — Telehealth: Payer: Self-pay | Admitting: *Deleted

## 2010-11-04 MED ORDER — HYDROCODONE-ACETAMINOPHEN 7.5-325 MG PO TABS: 1.0000 | ORAL_TABLET | Freq: Four times a day (QID) | ORAL | Status: DC | PRN

## 2010-11-04 NOTE — Telephone Encounter (Signed)
OK to fill this prescription with additional refills x2 Keep rov Thank you!

## 2010-11-04 NOTE — Telephone Encounter (Signed)
rec rf req for Hydroco/APAP 7.5-325 1 po qid prn # 120. Last filled 10/03/10. Ok to Rf?

## 2010-12-01 ENCOUNTER — Ambulatory Visit (INDEPENDENT_AMBULATORY_CARE_PROVIDER_SITE_OTHER): Payer: BC Managed Care – PPO | Admitting: *Deleted

## 2010-12-01 DIAGNOSIS — Z23 Encounter for immunization: Secondary | ICD-10-CM

## 2010-12-09 ENCOUNTER — Other Ambulatory Visit: Payer: Self-pay | Admitting: Internal Medicine

## 2010-12-23 ENCOUNTER — Other Ambulatory Visit: Payer: Self-pay | Admitting: Oncology

## 2010-12-23 ENCOUNTER — Encounter (HOSPITAL_BASED_OUTPATIENT_CLINIC_OR_DEPARTMENT_OTHER): Payer: BC Managed Care – PPO | Admitting: Oncology

## 2010-12-23 DIAGNOSIS — D696 Thrombocytopenia, unspecified: Secondary | ICD-10-CM

## 2010-12-23 DIAGNOSIS — L4050 Arthropathic psoriasis, unspecified: Secondary | ICD-10-CM

## 2010-12-23 LAB — CBC WITH DIFFERENTIAL/PLATELET
BASO%: 0 % (ref 0.0–2.0)
EOS%: 2.7 % (ref 0.0–7.0)
HCT: 37 % (ref 34.8–46.6)
MCH: 29 pg (ref 25.1–34.0)
MCHC: 32.4 g/dL (ref 31.5–36.0)
MCV: 89.4 fL (ref 79.5–101.0)
MONO%: 7.4 % (ref 0.0–14.0)
NEUT%: 63.5 % (ref 38.4–76.8)
lymph#: 0.8 10*3/uL — ABNORMAL LOW (ref 0.9–3.3)

## 2011-02-01 ENCOUNTER — Encounter: Payer: Self-pay | Admitting: Internal Medicine

## 2011-02-01 ENCOUNTER — Ambulatory Visit (INDEPENDENT_AMBULATORY_CARE_PROVIDER_SITE_OTHER): Payer: BC Managed Care – PPO | Admitting: Internal Medicine

## 2011-02-01 DIAGNOSIS — F419 Anxiety disorder, unspecified: Secondary | ICD-10-CM

## 2011-02-01 DIAGNOSIS — L408 Other psoriasis: Secondary | ICD-10-CM

## 2011-02-01 DIAGNOSIS — L409 Psoriasis, unspecified: Secondary | ICD-10-CM | POA: Insufficient documentation

## 2011-02-01 DIAGNOSIS — I10 Essential (primary) hypertension: Secondary | ICD-10-CM

## 2011-02-01 DIAGNOSIS — G4700 Insomnia, unspecified: Secondary | ICD-10-CM

## 2011-02-01 DIAGNOSIS — F411 Generalized anxiety disorder: Secondary | ICD-10-CM

## 2011-02-01 DIAGNOSIS — D693 Immune thrombocytopenic purpura: Secondary | ICD-10-CM

## 2011-02-01 DIAGNOSIS — M545 Low back pain, unspecified: Secondary | ICD-10-CM

## 2011-02-01 DIAGNOSIS — K7460 Unspecified cirrhosis of liver: Secondary | ICD-10-CM

## 2011-02-01 MED ORDER — CLONAZEPAM 0.5 MG PO TABS: 0.5000 mg | ORAL_TABLET | Freq: Two times a day (BID) | ORAL | Status: DC | PRN

## 2011-02-01 NOTE — Assessment & Plan Note (Signed)
Monitoring

## 2011-02-01 NOTE — Assessment & Plan Note (Signed)
Try Klonopin

## 2011-02-01 NOTE — Assessment & Plan Note (Signed)
On Rx 

## 2011-02-01 NOTE — Assessment & Plan Note (Signed)
Wt Readings from Last 3 Encounters:  02/01/11 277 lb (125.646 kg)  09/30/10 275 lb (124.739 kg)  08/10/10 98 lb 4.8 oz (44.589 kg)

## 2011-02-01 NOTE — Assessment & Plan Note (Signed)
On Rx. May join a study

## 2011-02-01 NOTE — Progress Notes (Signed)
  Subjective:    Patient ID: Lori Mills, female    DOB: [birth year], 60 y.o.   MRN: [id number]  HPI   The patient is here to follow up on chronic psoriasis, depression, anxiety, LBP  and chronic moderate fibromyalgia symptoms partially controlled with medicines   Review of Systems  Constitutional: Positive for fatigue. Negative for chills, activity change, appetite change and unexpected weight change.  HENT: Negative for congestion, mouth sores and sinus pressure.   Eyes: Negative for visual disturbance.  Respiratory: Negative for cough and chest tightness.   Gastrointestinal: Negative for nausea and abdominal pain.  Genitourinary: Negative for frequency, difficulty urinating and vaginal pain.  Musculoskeletal: Negative for back pain and gait problem.  Skin: Positive for rash. Negative for pallor.  Neurological: Negative for dizziness, tremors, weakness, numbness and headaches.  Psychiatric/Behavioral: Positive for sleep disturbance. Negative for suicidal ideas and confusion. The patient is nervous/anxious.        Objective:   Physical Exam  Constitutional: She appears well-developed. No distress.       Obese  HENT:  Head: Normocephalic.  Right Ear: External ear normal.  Left Ear: External ear normal.  Nose: Nose normal.  Mouth/Throat: Oropharynx is clear and moist.  Eyes: Conjunctivae are normal. Pupils are equal, round, and reactive to light. Right eye exhibits no discharge. Left eye exhibits no discharge.  Neck: Normal range of motion. Neck supple. No JVD present. No tracheal deviation present. No thyromegaly present.  Cardiovascular: Normal rate, regular rhythm and normal heart sounds.   Pulmonary/Chest: No stridor. No respiratory distress. She has no wheezes.  Abdominal: Soft. Bowel sounds are normal. She exhibits no distension and no mass. There is no tenderness. There is no rebound and no guarding.  Musculoskeletal: She exhibits no edema and no tenderness.    Lymphadenopathy:    She has no cervical adenopathy.  Neurological: She displays normal reflexes. No cranial nerve deficit. She exhibits normal muscle tone. Coordination normal.  Skin: Rash (extensive rashes) noted. No erythema.  Psychiatric: Her behavior is normal. Judgment and thought content normal.          Assessment & Plan:

## 2011-02-01 NOTE — Assessment & Plan Note (Signed)
Watching CBC 

## 2011-02-01 NOTE — Assessment & Plan Note (Signed)
BP Readings from Last 3 Encounters:  02/01/11 156/80  09/30/10 130/76  08/10/10 130/72  Worse - needs to loose wt

## 2011-02-01 NOTE — Assessment & Plan Note (Signed)
See Rx 

## 2011-02-03 ENCOUNTER — Ambulatory Visit: Payer: BC Managed Care – PPO | Admitting: Internal Medicine

## 2011-02-18 ENCOUNTER — Other Ambulatory Visit: Payer: Self-pay | Admitting: Internal Medicine

## 2011-02-20 ENCOUNTER — Telehealth: Payer: Self-pay | Admitting: *Deleted

## 2011-02-20 NOTE — Telephone Encounter (Signed)
Requested Medications     HYDROcodone-acetaminophen (NORCO) 7.5-325 MG per tablet [Pharmacy Med Name: HYDROCODON-ACETAMINOPH 7.5-325]   take 1 tablet by mouth four times a day if needed for pain   Disp: 120 tablet R: 2 Start: 02/18/2011  Class: Normal   Originally ordered on: 09/27/2010  Last refill: 01/15/2011

## 2011-02-20 NOTE — Telephone Encounter (Signed)
OK to fill this prescription with additional refills x2 Thank you!  

## 2011-02-21 MED ORDER — HYDROCODONE-ACETAMINOPHEN 7.5-325 MG PO TABS: 1.0000 | ORAL_TABLET | Freq: Four times a day (QID) | ORAL | Status: DC | PRN

## 2011-03-13 ENCOUNTER — Encounter: Payer: Self-pay | Admitting: Oncology

## 2011-03-13 DIAGNOSIS — D6959 Other secondary thrombocytopenia: Secondary | ICD-10-CM | POA: Insufficient documentation

## 2011-03-21 ENCOUNTER — Encounter: Payer: Self-pay | Admitting: *Deleted

## 2011-04-14 ENCOUNTER — Ambulatory Visit (HOSPITAL_BASED_OUTPATIENT_CLINIC_OR_DEPARTMENT_OTHER): Payer: BC Managed Care – PPO | Admitting: Oncology

## 2011-04-14 ENCOUNTER — Other Ambulatory Visit: Payer: Self-pay | Admitting: Oncology

## 2011-04-14 ENCOUNTER — Telehealth: Payer: Self-pay | Admitting: Oncology

## 2011-04-14 ENCOUNTER — Other Ambulatory Visit (HOSPITAL_BASED_OUTPATIENT_CLINIC_OR_DEPARTMENT_OTHER): Payer: BC Managed Care – PPO | Admitting: Lab

## 2011-04-14 VITALS — BP 153/81 | HR 73 | Temp 97.3°F | Wt 282.2 lb

## 2011-04-14 DIAGNOSIS — D696 Thrombocytopenia, unspecified: Secondary | ICD-10-CM

## 2011-04-14 DIAGNOSIS — L408 Other psoriasis: Secondary | ICD-10-CM

## 2011-04-14 DIAGNOSIS — L4050 Arthropathic psoriasis, unspecified: Secondary | ICD-10-CM

## 2011-04-14 DIAGNOSIS — E876 Hypokalemia: Secondary | ICD-10-CM

## 2011-04-14 DIAGNOSIS — D72819 Decreased white blood cell count, unspecified: Secondary | ICD-10-CM

## 2011-04-14 LAB — CHCC SMEAR

## 2011-04-14 LAB — COMPREHENSIVE METABOLIC PANEL
AST: 26 U/L (ref 0–37)
Albumin: 3.5 g/dL (ref 3.5–5.2)
Alkaline Phosphatase: 103 U/L (ref 39–117)
BUN: 7 mg/dL (ref 6–23)
Creatinine, Ser: 0.73 mg/dL (ref 0.50–1.10)
Glucose, Bld: 97 mg/dL (ref 70–99)
Total Bilirubin: 1.3 mg/dL — ABNORMAL HIGH (ref 0.3–1.2)

## 2011-04-14 LAB — CBC WITH DIFFERENTIAL/PLATELET
BASO%: 0.3 % (ref 0.0–2.0)
Basophils Absolute: 0 10*3/uL (ref 0.0–0.1)
EOS%: 3.4 % (ref 0.0–7.0)
HGB: 12.7 g/dL (ref 11.6–15.9)
MCH: 30.6 pg (ref 25.1–34.0)
MCHC: 34.2 g/dL (ref 31.5–36.0)
MCV: 89.4 fL (ref 79.5–101.0)
MONO%: 6.2 % (ref 0.0–14.0)
RBC: 4.16 10*6/uL (ref 3.70–5.45)
RDW: 14.9 % — ABNORMAL HIGH (ref 11.2–14.5)
lymph#: 0.7 10*3/uL — ABNORMAL LOW (ref 0.9–3.3)

## 2011-04-14 LAB — MORPHOLOGY

## 2011-04-14 NOTE — Progress Notes (Signed)
[facility] OFFICE PROGRESS NOTE  Sonda Primes, MD, MD [address] 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  INTERVAL HISTORY: Lori Mills 60 y.o. female returns for her followup. She reports that she has been doing well. She still has moderate to severe skin rash on her extremities both upper and lower third she has not been very adherent to UV light treatment because of busy working schedule. The skin rash is described as moderate to severe.  The rash has not worsened over the past few months.  It has not spread to the rest of her body.  The rash is pruritic however she not been taking much antiitching medication. She is planning to retire in 2013 in hopes to be more compliant than. With respect to her cytopenia; she denies bleeding symptoms recurrent infection. She has waking because of not being very active. The rest of the foot inquires as was negative.  MEDICAL HISTORY: Past Medical History  Diagnosis Date  . Psoriasis     Dr Yetta Barre  . Psoriatic arthritis     Dr Titus Dubin  . Depression   . HTN (hypertension)   . LBP (low back pain)   . Osteoarthritis   . Osteopenia   . Obesity   . Allergic rhinitis   . Decreased platelet count     ITP 2011 Dr Gaylyn Rong  . Hepatomegaly     Nash/2011 Dr Julieta Gutting [facility]-had MRI  . Cirrhosis of liver   . Thrombocytopenia due to hypersplenism      ALLERGIES:  is allergic to enalapril maleate; hydrochlorothiazide w/triamterene; ibuprofen; penicillins; and spironolactone.  MEDICATIONS: Current Outpatient Prescriptions  Medication Sig Dispense Refill  . Cholecalciferol (VITAMIN D3) 1000 UNITS tablet Take 1,000 Units by mouth daily.        . fluticasone (VERAMYST) 27.5 MCG/SPRAY nasal spray Place 2 sprays into the nose as needed.       . gabapentin (NEURONTIN) 100 MG capsule Take 1 capsule (100 mg  total) by mouth 2 (two) times daily. (Or As Directed)  60 capsule  5  . HYDROcodone-acetaminophen (NORCO) 7.5-325 MG per tablet Take 1 tablet by mouth 4 (four) times daily as needed. For pain  120 tablet  2  . Lactobacillus (ACIDOPHILUS) CAPS Take by mouth as needed.        Marland Kitchen levothyroxine (SYNTHROID, LEVOTHROID) 50 MCG tablet take 1 tablet by mouth once daily for THYROID  30 tablet  5  . loratadine (CLARITIN) 10 MG tablet Take 10 mg by mouth daily. For allergies        . triamcinolone (KENALOG) 0.1 % cream Apply topically 2 (two) times daily.        . vitamin B-12 (CYANOCOBALAMIN) 1000 MCG tablet Take 1,000 mcg by mouth daily.        . clonazePAM (KLONOPIN) 0.5 MG tablet Take 1 tablet (0.5 mg total) by mouth 2 (two) times daily as needed for anxiety.  60 tablet  2  . furosemide (LASIX) 40 MG tablet Take 40 mg by mouth as needed. (a water pill)      . losartan (COZAAR) 100 MG tablet Take 100 mg by mouth as needed.       . potassium chloride (KLOR-CON) 10 MEQ CR tablet Take 10 mEq by mouth as needed.       . temazepam (RESTORIL) 15 MG capsule Take  15-30 mg by mouth at bedtime as needed. For Insomnia         SURGICAL HISTORY:  Past Surgical History  Procedure Date  . None to report     REVIEW OF SYSTEMS:  Pertinent items are noted in HPI.   PHYSICAL EXAMINATION: ECOG PERFORMANCE STATUS: 0-1  Filed Vitals:   04/14/11 0950  BP: 153/81  Pulse: 73  Temp: 97.3 F (36.3 C)   General:  Moderately obese woman in no acute distress.  Eyes:  no scleral icterus.  ENT:  There were no oropharyngeal lesions.  Neck was without thyromegaly.  Lymphatics:  Negative cervical, supraclavicular or axillary adenopathy.  Respiratory: lungs were clear bilaterally without wheezing or crackles.  Cardiovascular:  Regular rate and rhythm, S1/S2, without murmur, rub or gallop.  There was no pedal edema.  GI:  abdomen was soft, obese, nontender, nondistended, without organomegaly.  Muscoloskeletal:  no spinal  tenderness of palpation of vertebral spine.  Skin exam showed erythematous macular papular with scaly thickened changes.   Neuro exam was nonfocal.  Patient was able to get on and off exam table without assistance.  Gait was normal.  Patient was alerted and oriented.  Attention was good.   Language was appropriate.  Mood was normal without depression.  Speech was not pressured.  Thought content was not tangential.     LABORATORY DATA: CBC:  WBC 3.1; ANC 2.1; Hgb 12.7; Plt 72.  CMET pending.    ASSESSMENT AND PLAN:  1. 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  She is no active bleeding. There is no need for transfusion. I recommend continued observation patient's lab only appointment in 4 months and to see me in 8 months. 2. Psoriasis:  Under the care of Dr Karlyn Agee.  She hadn't been adherent with UV light treatment because of obesity working schedule and also pruritus with UV light treatment. She is planning to retire 2013 aggressive local therapy like treatment to improve her symptoms as opposed to immunosuppressive therapy to avoid worsening her cytopenia.  3. History of cirrhosis secondary to NASH:  She f/u with Dr Julieta Gutting.

## 2011-04-14 NOTE — Telephone Encounter (Signed)
gve the pt her march 2012,july 2013 appt calendar.

## 2011-04-14 NOTE — Progress Notes (Signed)
I personally reviewed her peripheral blood smear today.  There was isocytosis.  There was no peripheral blast.  There was no schistocytosis, spherocytosis, target cell, rouleaux formation, tear drop cell.  There was no giant platelets or platelet clumps.  Platelets appeared decreased otherwise.

## 2011-05-05 ENCOUNTER — Ambulatory Visit: Payer: BC Managed Care – PPO | Admitting: Internal Medicine

## 2011-05-15 ENCOUNTER — Encounter: Payer: Self-pay | Admitting: Internal Medicine

## 2011-05-15 ENCOUNTER — Ambulatory Visit (INDEPENDENT_AMBULATORY_CARE_PROVIDER_SITE_OTHER): Payer: BC Managed Care – PPO | Admitting: Internal Medicine

## 2011-05-15 DIAGNOSIS — J0100 Acute maxillary sinusitis, unspecified: Secondary | ICD-10-CM

## 2011-05-15 DIAGNOSIS — J019 Acute sinusitis, unspecified: Secondary | ICD-10-CM

## 2011-05-15 DIAGNOSIS — F411 Generalized anxiety disorder: Secondary | ICD-10-CM

## 2011-05-15 DIAGNOSIS — F329 Major depressive disorder, single episode, unspecified: Secondary | ICD-10-CM

## 2011-05-15 DIAGNOSIS — E039 Hypothyroidism, unspecified: Secondary | ICD-10-CM

## 2011-05-15 DIAGNOSIS — F419 Anxiety disorder, unspecified: Secondary | ICD-10-CM

## 2011-05-15 MED ORDER — AZITHROMYCIN 250 MG PO TABS: ORAL_TABLET | ORAL | Status: AC

## 2011-05-15 MED ORDER — CLONAZEPAM 0.5 MG PO TABS: 0.5000 mg | ORAL_TABLET | Freq: Two times a day (BID) | ORAL | Status: DC | PRN

## 2011-05-15 NOTE — Assessment & Plan Note (Signed)
Continue with current prescription therapy as reflected on the Med list.  

## 2011-05-15 NOTE — Assessment & Plan Note (Signed)
She ran out of med - restart

## 2011-05-15 NOTE — Progress Notes (Signed)
  Subjective:    Patient ID: Lori Mills, female    DOB: November 28, 1950, 60 y.o.   MRN: [id number]  HPI   HPI  C/o URI sx's x  7 days. C/o ST, cough, weakness. Not better with OTC medicines. Actually, the patient is getting worse. The patient did not sleep last night due to cough.  Review of Systems  Constitutional: Positive for fever, chills and fatigue.  HENT: Positive for congestion, rhinorrhea, sneezing and postnasal drip.   Eyes: Positive for photophobia and pain. Negative for discharge and visual disturbance.  Respiratory: Positive for cough and wheezing.   Positive for chest pain.  Gastrointestinal: Negative for vomiting, abdominal pain, diarrhea and abdominal distention.  Genitourinary: Negative for dysuria and difficulty urinating.  Skin: Negative for rash.  Neurological: Positive for dizziness, weakness and light-headedness.      Review of Systems     Objective:   Physical Exam  Constitutional: She appears well-developed. No distress.       obese  HENT:  Head: Normocephalic.  Right Ear: External ear normal.  Left Ear: External ear normal.  Nose: Nose normal.       eryth throat  Eyes: Conjunctivae are normal. Pupils are equal, round, and reactive to light. Right eye exhibits no discharge. Left eye exhibits no discharge.  Neck: Normal range of motion. Neck supple. No JVD present. No tracheal deviation present. No thyromegaly present.  Cardiovascular: Normal rate, regular rhythm and normal heart sounds.   Pulmonary/Chest: No stridor. No respiratory distress. She has no wheezes.  Abdominal: Soft. Bowel sounds are normal. She exhibits no distension and no mass. There is no tenderness. There is no rebound and no guarding.  Musculoskeletal: She exhibits no edema and no tenderness.  Lymphadenopathy:    She has no cervical adenopathy.  Neurological: She displays normal reflexes. No cranial nerve deficit. She exhibits normal muscle tone. Coordination normal.  Skin: Rash  noted. No erythema.  Psychiatric: She has a normal mood and affect. Her behavior is normal. Judgment and thought content normal.          Assessment & Plan:

## 2011-05-15 NOTE — Assessment & Plan Note (Signed)
Zpac 

## 2011-05-15 NOTE — Assessment & Plan Note (Signed)
She is planning wt loss after Feb 2013

## 2011-05-26 ENCOUNTER — Ambulatory Visit: Payer: BC Managed Care – PPO | Admitting: Internal Medicine

## 2011-05-31 ENCOUNTER — Other Ambulatory Visit: Payer: Self-pay

## 2011-05-31 MED ORDER — TRIAMCINOLONE ACETONIDE 0.1 % EX CREA: TOPICAL_CREAM | Freq: Two times a day (BID) | CUTANEOUS | Status: DC

## 2011-06-01 ENCOUNTER — Other Ambulatory Visit: Payer: Self-pay

## 2011-06-01 NOTE — Telephone Encounter (Signed)
OK to call in Kenalog 0.5% cream in Eucerin lotion 1:5  500g 3 ref - needs to be mixed by a compound pharmacy - ie [place]

## 2011-06-01 NOTE — Telephone Encounter (Signed)
Pt called requesting Rx for Psoriasis. Pt says that previously used Kenalog mixed with either Eucerin or Cetaphil in a large jar. Pt is requesting Rx to pharmacy, please advise.

## 2011-06-02 NOTE — Telephone Encounter (Signed)
Rx phoned in to [facility]..Pt informed

## 2011-06-12 ENCOUNTER — Other Ambulatory Visit: Payer: Self-pay | Admitting: Internal Medicine

## 2011-06-13 ENCOUNTER — Telehealth: Payer: Self-pay | Admitting: *Deleted

## 2011-06-13 NOTE — Telephone Encounter (Signed)
Requested Medications     HYDROcodone-acetaminophen (NORCO) 7.5-325 MG per tablet [Pharmacy Med Name: HYDROCODON-ACETAMINOPH 7.5-325]   take 1 tablet by mouth four times a day if needed   Disp: 120 tablet R: 2 Start: 06/12/2011  Class: Normal   Originally ordered on: 09/27/2010  Last refill: 05/14/2011

## 2011-06-13 NOTE — Telephone Encounter (Signed)
OK to fill this prescription with additional refills x2 Thank you!  

## 2011-06-15 ENCOUNTER — Encounter: Payer: Self-pay | Admitting: Internal Medicine

## 2011-06-15 ENCOUNTER — Ambulatory Visit (INDEPENDENT_AMBULATORY_CARE_PROVIDER_SITE_OTHER): Payer: BC Managed Care – PPO | Admitting: Internal Medicine

## 2011-06-15 VITALS — BP 128/62 | HR 72 | Temp 97.2°F | Resp 16 | Wt 278.0 lb

## 2011-06-15 DIAGNOSIS — I10 Essential (primary) hypertension: Secondary | ICD-10-CM

## 2011-06-15 DIAGNOSIS — J40 Bronchitis, not specified as acute or chronic: Secondary | ICD-10-CM

## 2011-06-15 DIAGNOSIS — J019 Acute sinusitis, unspecified: Secondary | ICD-10-CM

## 2011-06-15 MED ORDER — PROMETHAZINE-CODEINE 6.25-10 MG/5ML PO SYRP: 5.0000 mL | ORAL_SOLUTION | ORAL | Status: AC | PRN

## 2011-06-15 MED ORDER — AMOXICILLIN-POT CLAVULANATE 875-125 MG PO TABS: 1.0000 | ORAL_TABLET | Freq: Two times a day (BID) | ORAL | Status: AC

## 2011-06-15 MED ORDER — HYDROCODONE-ACETAMINOPHEN 7.5-325 MG PO TABS: 1.0000 | ORAL_TABLET | Freq: Four times a day (QID) | ORAL | Status: DC | PRN

## 2011-06-15 NOTE — Patient Instructions (Signed)
Use over-the-counter  "cold" medicines  such as"Afrin" nasal spray for nasal congestion as directed instead. Use" Delsym" or" Robitussin" cough syrup varietis for cough.  You can use plain "Tylenol" or "Advil' for fever, chills and achyness.   

## 2011-06-15 NOTE — Progress Notes (Signed)
  Subjective:    Patient ID: Lori Mills, female    DOB: 21-Jul-1950, 61 y.o.   MRN: [id number]  HPI   HPI  C/o URI sx's x  19 days. C/o ST, cough, weakness. Not better with OTC medicines. Actually, the patient is getting worse. The patient did not sleep last night due to cough. Green sputum.  Review of Systems  Constitutional: Positive for fever, chills and fatigue.  HENT: Positive for congestion, rhinorrhea, sneezing and postnasal drip.   Eyes: Positive for photophobia and pain. Negative for discharge and visual disturbance.  Respiratory: Positive for cough and wheezing.   Positive for chest pain.  Gastrointestinal: Negative for vomiting, abdominal pain, diarrhea and abdominal distention.  Genitourinary: Negative for dysuria and difficulty urinating.  Skin: Negative for rash.  Neurological: Positive for dizziness, weakness and light-headedness.      Review of Systems     Objective:   Physical Exam  Constitutional: She appears well-developed. No distress.       obese  HENT:  Head: Normocephalic.  Right Ear: External ear normal.  Left Ear: External ear normal.  Nose: Nose normal.  Mouth/Throat: Oropharynx is clear and moist.       Green mucus  Eyes: Conjunctivae are normal. Pupils are equal, round, and reactive to light. Right eye exhibits no discharge. Left eye exhibits no discharge.  Neck: Normal range of motion. Neck supple. No JVD present. No tracheal deviation present. No thyromegaly present.  Cardiovascular: Normal rate, regular rhythm and normal heart sounds.   Pulmonary/Chest: No stridor. No respiratory distress. She has no wheezes.  Abdominal: Soft. Bowel sounds are normal. She exhibits no distension and no mass. There is no tenderness. There is no rebound and no guarding.  Musculoskeletal: She exhibits no edema and no tenderness.  Lymphadenopathy:    She has no cervical adenopathy.  Neurological: She displays normal reflexes. No cranial nerve deficit. She  exhibits normal muscle tone. Coordination normal.  Skin: Rash noted. There is erythema.  Psychiatric: She has a normal mood and affect. Her behavior is normal. Judgment and thought content normal.          Assessment & Plan:

## 2011-06-16 MED ORDER — METHYLPREDNISOLONE ACETATE PF 80 MG/ML IJ SUSP
120.0000 mg | Freq: Once | INTRAMUSCULAR | Status: AC
  Administered 2011-06-16: 120 mg via INTRAMUSCULAR

## 2011-06-18 NOTE — Assessment & Plan Note (Signed)
Discussed wt loss 

## 2011-06-18 NOTE — Assessment & Plan Note (Signed)
See Meds 

## 2011-06-18 NOTE — Assessment & Plan Note (Signed)
Continue with current prescription therapy as reflected on the Med list.  

## 2011-07-24 ENCOUNTER — Other Ambulatory Visit: Payer: Self-pay | Admitting: Internal Medicine

## 2011-08-07 ENCOUNTER — Telehealth: Payer: Self-pay | Admitting: *Deleted

## 2011-08-07 ENCOUNTER — Other Ambulatory Visit (HOSPITAL_BASED_OUTPATIENT_CLINIC_OR_DEPARTMENT_OTHER): Payer: BC Managed Care – PPO | Admitting: Lab

## 2011-08-07 DIAGNOSIS — D696 Thrombocytopenia, unspecified: Secondary | ICD-10-CM

## 2011-08-07 LAB — CBC WITH DIFFERENTIAL/PLATELET
Basophils Absolute: 0 10*3/uL (ref 0.0–0.1)
Eosinophils Absolute: 0.1 10*3/uL (ref 0.0–0.5)
HCT: 37.6 % (ref 34.8–46.6)
HGB: 12.9 g/dL (ref 11.6–15.9)
LYMPH%: 24.3 % (ref 14.0–49.7)
MONO#: 0.2 10*3/uL (ref 0.1–0.9)
NEUT#: 2.1 10*3/uL (ref 1.5–6.5)
NEUT%: 66.5 % (ref 38.4–76.8)
Platelets: 68 10*3/uL — ABNORMAL LOW (ref 145–400)
WBC: 3.2 10*3/uL — ABNORMAL LOW (ref 3.9–10.3)
lymph#: 0.8 10*3/uL — ABNORMAL LOW (ref 0.9–3.3)

## 2011-08-07 NOTE — Telephone Encounter (Signed)
Message copied by Wende Mott on Mon Aug 07, 2011 12:01 PM ------      Message from: HA, Raliegh Ip T      Created: Mon Aug 07, 2011  9:48 AM       Please call pt.  Her WBC and plt are relatively stable.  Continue observation from hematology standpoint.  Thanks.

## 2011-08-07 NOTE — Telephone Encounter (Signed)
Called pt to inform her of her WBC and Platelets relatively stable per Dr. Gaylyn Rong.  Next appt should be in about 4 months, call to make appt if she has not heard from [place] in a few months.  Pt verbalized understanding.

## 2011-08-15 ENCOUNTER — Ambulatory Visit: Payer: BC Managed Care – PPO | Admitting: Internal Medicine

## 2011-09-13 ENCOUNTER — Encounter: Payer: Self-pay | Admitting: Internal Medicine

## 2011-09-13 ENCOUNTER — Ambulatory Visit (INDEPENDENT_AMBULATORY_CARE_PROVIDER_SITE_OTHER): Payer: BC Managed Care – PPO | Admitting: Internal Medicine

## 2011-09-13 ENCOUNTER — Other Ambulatory Visit (INDEPENDENT_AMBULATORY_CARE_PROVIDER_SITE_OTHER): Payer: BC Managed Care – PPO

## 2011-09-13 VITALS — BP 128/72 | HR 72 | Temp 97.1°F | Resp 16 | Wt 279.0 lb

## 2011-09-13 DIAGNOSIS — L409 Psoriasis, unspecified: Secondary | ICD-10-CM

## 2011-09-13 DIAGNOSIS — E039 Hypothyroidism, unspecified: Secondary | ICD-10-CM

## 2011-09-13 DIAGNOSIS — F329 Major depressive disorder, single episode, unspecified: Secondary | ICD-10-CM

## 2011-09-13 DIAGNOSIS — L408 Other psoriasis: Secondary | ICD-10-CM

## 2011-09-13 DIAGNOSIS — E538 Deficiency of other specified B group vitamins: Secondary | ICD-10-CM

## 2011-09-13 DIAGNOSIS — M545 Low back pain: Secondary | ICD-10-CM

## 2011-09-13 LAB — HEPATIC FUNCTION PANEL
AST: 30 U/L (ref 0–37)
Albumin: 3.6 g/dL (ref 3.5–5.2)
Alkaline Phosphatase: 106 U/L (ref 39–117)
Total Protein: 6.8 g/dL (ref 6.0–8.3)

## 2011-09-13 LAB — BASIC METABOLIC PANEL
Calcium: 9.1 mg/dL (ref 8.4–10.5)
Creatinine, Ser: 0.8 mg/dL (ref 0.4–1.2)
GFR: 79.48 mL/min (ref >60.00)

## 2011-09-13 LAB — TSH: TSH: 2.26 u[IU]/mL (ref 0.35–5.50)

## 2011-09-13 MED ORDER — LORATADINE 10 MG PO TABS: 10.0000 mg | ORAL_TABLET | Freq: Every day | ORAL | Status: DC

## 2011-09-13 MED ORDER — HYDROCODONE-ACETAMINOPHEN 7.5-325 MG PO TABS: 1.0000 | ORAL_TABLET | Freq: Four times a day (QID) | ORAL | Status: DC | PRN

## 2011-09-13 MED ORDER — FLUTICASONE FUROATE 27.5 MCG/SPRAY NA SUSP: 2.0000 | NASAL | Status: DC | PRN

## 2011-09-13 NOTE — Assessment & Plan Note (Signed)
Discussed options 

## 2011-09-13 NOTE — Assessment & Plan Note (Signed)
Continue with current prescription therapy as reflected on the Med list.  

## 2011-09-18 NOTE — Progress Notes (Signed)
Patient ID: Lori Mills, female   DOB: [birth year], 61 y.o.   MRN: [id number]  Subjective:    Patient ID: Lori Mills, female    DOB: 1950/10/16, 61 y.o.   MRN: [id number]  HPI   The patient is here to follow up on chronic psoriasis, depression, anxiety, LBP  and chronic moderate fibromyalgia symptoms partially controlled with medicines   Review of Systems  Constitutional: Positive for fatigue. Negative for chills, activity change, appetite change and unexpected weight change.  HENT: Negative for congestion, mouth sores and sinus pressure.   Eyes: Negative for visual disturbance.  Respiratory: Negative for cough and chest tightness.   Gastrointestinal: Negative for nausea and abdominal pain.  Genitourinary: Negative for frequency, difficulty urinating and vaginal pain.  Musculoskeletal: Negative for back pain and gait problem.  Skin: Positive for rash. Negative for pallor.  Neurological: Negative for dizziness, tremors, weakness, numbness and headaches.  Psychiatric/Behavioral: Positive for sleep disturbance. Negative for suicidal ideas and confusion. The patient is nervous/anxious.        Objective:   Physical Exam  Constitutional: She appears well-developed. No distress.       Obese  HENT:  Head: Normocephalic.  Right Ear: External ear normal.  Left Ear: External ear normal.  Nose: Nose normal.  Mouth/Throat: Oropharynx is clear and moist.  Eyes: Conjunctivae are normal. Pupils are equal, round, and reactive to light. Right eye exhibits no discharge. Left eye exhibits no discharge.  Neck: Normal range of motion. Neck supple. No JVD present. No tracheal deviation present. No thyromegaly present.  Cardiovascular: Normal rate, regular rhythm and normal heart sounds.   Pulmonary/Chest: No stridor. No respiratory distress. She has no wheezes.  Abdominal: Soft. Bowel sounds are normal. She exhibits no distension and no mass. There is no tenderness. There is no rebound and no  guarding.  Musculoskeletal: She exhibits no edema and no tenderness.  Lymphadenopathy:    She has no cervical adenopathy.  Neurological: She displays normal reflexes. No cranial nerve deficit. She exhibits normal muscle tone. Coordination normal.  Skin: Rash (extensive rashes) noted. No erythema.  Psychiatric: Her behavior is normal. Judgment and thought content normal.          Assessment & Plan:

## 2011-09-21 ENCOUNTER — Telehealth: Payer: Self-pay | Admitting: *Deleted

## 2011-09-21 NOTE — Telephone Encounter (Signed)
Called pt's ins plan. PA for Lori Mills is approved until 09/20/12. Case ID# is [id number]. Pt and pharmacy informed.

## 2011-10-06 ENCOUNTER — Other Ambulatory Visit: Payer: Self-pay | Admitting: *Deleted

## 2011-10-06 MED ORDER — LEVOTHYROXINE SODIUM 50 MCG PO TABS: 50.0000 ug | ORAL_TABLET | Freq: Every day | ORAL | Status: DC

## 2011-11-27 ENCOUNTER — Other Ambulatory Visit: Payer: Self-pay | Admitting: Internal Medicine

## 2011-12-13 ENCOUNTER — Other Ambulatory Visit: Payer: BC Managed Care – PPO

## 2011-12-14 ENCOUNTER — Ambulatory Visit (INDEPENDENT_AMBULATORY_CARE_PROVIDER_SITE_OTHER): Payer: BC Managed Care – PPO | Admitting: Internal Medicine

## 2011-12-14 ENCOUNTER — Encounter: Payer: Self-pay | Admitting: Internal Medicine

## 2011-12-14 VITALS — BP 130/80 | HR 84 | Temp 98.1°F | Resp 16 | Wt 279.0 lb

## 2011-12-14 DIAGNOSIS — F3289 Other specified depressive episodes: Secondary | ICD-10-CM

## 2011-12-14 DIAGNOSIS — R160 Hepatomegaly, not elsewhere classified: Secondary | ICD-10-CM

## 2011-12-14 DIAGNOSIS — F411 Generalized anxiety disorder: Secondary | ICD-10-CM

## 2011-12-14 DIAGNOSIS — I10 Essential (primary) hypertension: Secondary | ICD-10-CM

## 2011-12-14 DIAGNOSIS — M545 Low back pain, unspecified: Secondary | ICD-10-CM

## 2011-12-14 DIAGNOSIS — R609 Edema, unspecified: Secondary | ICD-10-CM

## 2011-12-14 DIAGNOSIS — D6959 Other secondary thrombocytopenia: Secondary | ICD-10-CM

## 2011-12-14 DIAGNOSIS — K7460 Unspecified cirrhosis of liver: Secondary | ICD-10-CM

## 2011-12-14 DIAGNOSIS — F419 Anxiety disorder, unspecified: Secondary | ICD-10-CM

## 2011-12-14 DIAGNOSIS — F329 Major depressive disorder, single episode, unspecified: Secondary | ICD-10-CM

## 2011-12-14 DIAGNOSIS — D731 Hypersplenism: Secondary | ICD-10-CM

## 2011-12-14 NOTE — Assessment & Plan Note (Signed)
Discussed.

## 2011-12-14 NOTE — Assessment & Plan Note (Signed)
Chronic. 

## 2011-12-14 NOTE — Assessment & Plan Note (Signed)
Continue with current prescription therapy as reflected on the Med list.  

## 2011-12-14 NOTE — Assessment & Plan Note (Signed)
Chronic due to cirrhosis GI at [facility] - had an MRI 2013

## 2011-12-14 NOTE — Progress Notes (Signed)
   Subjective:    Patient ID: Lori Mills, female    DOB: [birth year], 61 y.o.   MRN: [id number]  HPI   The patient is here to follow up on chronic psoriasis, depression, anxiety, LBP  and chronic moderate fibromyalgia symptoms partially controlled with medicines   BP Readings from Last 3 Encounters:  12/14/11 130/80  09/13/11 128/72  06/15/11 128/62   Wt Readings from Last 3 Encounters:  12/14/11 279 lb (126.554 kg)  09/13/11 279 lb (126.554 kg)  06/15/11 278 lb (126.1 kg)       Review of Systems  Constitutional: Positive for fatigue. Negative for chills, activity change, appetite change and unexpected weight change.  HENT: Negative for congestion, mouth sores and sinus pressure.   Eyes: Negative for visual disturbance.  Respiratory: Negative for cough and chest tightness.   Gastrointestinal: Negative for nausea and abdominal pain.  Genitourinary: Negative for frequency, difficulty urinating and vaginal pain.  Musculoskeletal: Negative for back pain and gait problem.  Skin: Positive for rash. Negative for pallor.  Neurological: Negative for dizziness, tremors, weakness, numbness and headaches.  Psychiatric/Behavioral: Positive for disturbed wake/sleep cycle. Negative for suicidal ideas and confusion. The patient is nervous/anxious.        Objective:   Physical Exam  Constitutional: She appears well-developed. No distress.       Obese  HENT:  Head: Normocephalic.  Right Ear: External ear normal.  Left Ear: External ear normal.  Nose: Nose normal.  Mouth/Throat: Oropharynx is clear and moist.  Eyes: Conjunctivae are normal. Pupils are equal, round, and reactive to light. Right eye exhibits no discharge. Left eye exhibits no discharge.  Neck: Normal range of motion. Neck supple. No JVD present. No tracheal deviation present. No thyromegaly present.  Cardiovascular: Normal rate, regular rhythm and normal heart sounds.   Pulmonary/Chest: No stridor. No respiratory  distress. She has no wheezes.  Abdominal: Soft. Bowel sounds are normal. She exhibits no distension and no mass. There is no tenderness. There is no rebound and no guarding.  Musculoskeletal: She exhibits no edema and no tenderness.  Lymphadenopathy:    She has no cervical adenopathy.  Neurological: She displays normal reflexes. No cranial nerve deficit. She exhibits normal muscle tone. Coordination normal.  Skin: Rash (extensive rashes) noted. No erythema.  Psychiatric: Her behavior is normal. Judgment and thought content normal.     Lab Results  Component Value Date   WBC 3.2* 08/07/2011   HGB 12.9 08/07/2011   HCT 37.6 08/07/2011   PLT 68* 08/07/2011   GLUCOSE 101* 09/13/2011   CHOL 164 06/02/2010   TRIG 97.0 06/02/2010   HDL 43.50 06/02/2010   LDLCALC 101* 06/02/2010   ALT 20 09/13/2011   AST 30 09/13/2011   NA 143 09/13/2011   K 4.1 09/13/2011   CL 106 09/13/2011   CREATININE 0.8 09/13/2011   BUN 7 09/13/2011   CO2 30 09/13/2011   TSH 2.26 09/13/2011   INR 1.24 11/24/2009   HGBA1C 5.0 02/01/2009        Assessment & Plan:

## 2011-12-15 ENCOUNTER — Ambulatory Visit (HOSPITAL_BASED_OUTPATIENT_CLINIC_OR_DEPARTMENT_OTHER): Payer: BC Managed Care – PPO | Admitting: Oncology

## 2011-12-15 ENCOUNTER — Other Ambulatory Visit: Payer: BC Managed Care – PPO | Admitting: Lab

## 2011-12-15 VITALS — BP 156/76 | HR 70 | Temp 96.9°F | Ht 63.5 in | Wt 280.2 lb

## 2011-12-15 DIAGNOSIS — D696 Thrombocytopenia, unspecified: Secondary | ICD-10-CM

## 2011-12-15 LAB — COMPREHENSIVE METABOLIC PANEL
Albumin: 3.5 g/dL (ref 3.5–5.2)
Alkaline Phosphatase: 112 U/L (ref 39–117)
BUN: 7 mg/dL (ref 6–23)
Creatinine, Ser: 0.65 mg/dL (ref 0.50–1.10)
Glucose, Bld: 91 mg/dL (ref 70–99)
Potassium: 4 mEq/L (ref 3.5–5.3)

## 2011-12-15 LAB — CBC WITH DIFFERENTIAL/PLATELET
Basophils Absolute: 0 10*3/uL (ref 0.0–0.1)
Eosinophils Absolute: 0.1 10*3/uL (ref 0.0–0.5)
HCT: 38.9 % (ref 34.8–46.6)
HGB: 13 g/dL (ref 11.6–15.9)
MCV: 91.5 fL (ref 79.5–101.0)
MONO%: 8.5 % (ref 0.0–14.0)
NEUT#: 1.8 10*3/uL (ref 1.5–6.5)
NEUT%: 63.3 % (ref 38.4–76.8)
RDW: 14.6 % — ABNORMAL HIGH (ref 11.2–14.5)
lymph#: 0.7 10*3/uL — ABNORMAL LOW (ref 0.9–3.3)

## 2011-12-15 NOTE — Patient Instructions (Addendum)
1.  Issues:  Low WBC and platelets due to cirrhosis.  Past bone marrow biopsy in 2011 was non revealing.  I have low clinical suspicion for any bone marrow disease.  2.  Disposition:  Discharge back to primary physician and liver physician.  Please have your blood count check about twice a year to ensure that your counts are not significantly worsened.  Our service is always available in the future if the need arises.    Thank you.

## 2011-12-15 NOTE — Progress Notes (Signed)
[facility] OFFICE PROGRESS NOTE  Sonda Primes, MD [address] 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  INTERVAL HISTORY: Lori Mills 61 y.o. female returns for follow up by herself.  She still has psoriasis skin rash on her arms and her back.  She is using over-the-counter lotion for the rash. She denies any visible source of bleeding. She recently quit her job at a [employer] and is working part-time with a Engineer, maintenance. She is trying her best to lose weight. She will try to use gluten-free diet per her PCP's recommendation.  She is also planning to go back to her surgeon for gastric banding.  Patient denies fever, anorexia, weight loss, fatigue, headache, visual changes, confusion, drenching night sweats, palpable lymph node swelling, mucositis, odynophagia, dysphagia, nausea vomiting, jaundice, chest pain, palpitation, shortness of breath, dyspnea on exertion, productive cough, gum bleeding, epistaxis, hematemesis, hemoptysis, abdominal pain, abdominal swelling, early satiety, melena, hematochezia, hematuria, spontaneous bleeding, joint swelling, joint pain, heat or cold intolerance, bowel bladder incontinence, back pain, focal motor weakness, paresthesia, depression, suicidal or homocidal ideation, feeling hopelessness.   MEDICAL HISTORY: Past Medical History  Diagnosis Date  . Psoriasis     Dr Yetta Barre  . Psoriatic arthritis     Dr Titus Dubin  . Depression   . HTN (hypertension)   . LBP (low back pain)   . Osteoarthritis   . Osteopenia   . Obesity   . Allergic rhinitis   . Decreased platelet count     ITP 2011 Dr Gaylyn Rong  . Hepatomegaly     [place]/2011 Dr Julieta Gutting [facility]-had MRI  . Cirrhosis of liver   . Thrombocytopenia due to hypersplenism      ALLERGIES:  is allergic to enalapril maleate; hydrochlorothiazide  w-triamterene; ibuprofen; penicillins; and spironolactone.  MEDICATIONS: Current Outpatient Prescriptions  Medication Sig Dispense Refill  . cholecalciferol (VITAMIN D) 400 UNITS TABS Take 400 Units by mouth daily.      . Cholecalciferol (VITAMIN D3) 1000 UNITS tablet Take 1,000 Units by mouth daily.        . clonazePAM (KLONOPIN) 0.5 MG tablet Take 1 tablet (0.5 mg total) by mouth 2 (two) times daily as needed for anxiety.  60 tablet  2  . fluticasone (VERAMYST) 27.5 MCG/SPRAY nasal spray Place 2 sprays into the nose as needed for rhinitis.  10 g  11  . furosemide (LASIX) 40 MG tablet Take 40 mg by mouth as needed. (a water pill)      . gabapentin (NEURONTIN) 100 MG capsule take 1 capsule by mouth twice a day OR as directed  60 capsule  5  . HYDROcodone-acetaminophen (NORCO) 7.5-325 MG per tablet Take 1 tablet by mouth 4 (four) times daily as needed. For pain  120 tablet  2  . Lactobacillus (ACIDOPHILUS) CAPS Take by mouth as needed.        Marland Kitchen levothyroxine (SYNTHROID, LEVOTHROID) 50 MCG tablet Take 1 tablet (50 mcg total) by mouth daily.  30 tablet  5  . loratadine (CLARITIN) 10 MG tablet Take 1 tablet (10 mg total) by mouth daily. For allergies  100 tablet  3  . losartan (COZAAR) 100 MG tablet take 1 tablet by mouth once daily  30 tablet  5  . potassium chloride (KLOR-CON) 10 MEQ CR tablet Take 10 mEq by mouth as needed.       Marland Kitchen  temazepam (RESTORIL) 15 MG capsule Take 15-30 mg by mouth at bedtime as needed. For Insomnia       . triamcinolone cream (KENALOG) 0.1 % Apply topically 2 (two) times daily.  30 g  1  . vitamin B-12 (CYANOCOBALAMIN) 1000 MCG tablet Take 1,000 mcg by mouth daily.          SURGICAL HISTORY:  Past Surgical History  Procedure Date  . None to report     REVIEW OF SYSTEMS:  Pertinent items are noted in HPI.   PHYSICAL EXAMINATION: ECOG PERFORMANCE STATUS: 0-1  Filed Vitals:   12/15/11 0949  BP: 156/76  Pulse: 70  Temp: 96.9 F (36.1 C)   General:   Moderately obese woman in no acute distress.  Eyes:  no scleral icterus.  ENT:  There were no oropharyngeal lesions.  Neck was without thyromegaly.  Lymphatics:  Negative cervical, supraclavicular or axillary adenopathy.  Respiratory: lungs were clear bilaterally without wheezing or crackles.  Cardiovascular:  Regular rate and rhythm, S1/S2, without murmur, rub or gallop.  There was no pedal edema.  GI:  abdomen was soft, obese, nontender, nondistended, without organomegaly.  Muscoloskeletal:  no spinal tenderness of palpation of vertebral spine.  Skin exam showed erythematous macular papular with scaly thickened changes in her bilateral arms and back.   Neuro exam was nonfocal.  Patient was able to get on and off exam table without assistance.  Gait was normal.  Patient was alerted and oriented.  Attention was good.   Language was appropriate.  Mood was normal without depression.  Speech was not pressured.  Thought content was not tangential.     LABORATORY DATA:  WBC 2.9; Hgb 13; Plt 66.   ASSESSMENT AND PLAN:  1. 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  She is no active bleeding. There is no need for transfusion. I recommend continued observation.  Since her CBC has been quite stable, and there is no active treatment from Hem/Onc service, I discharged her back to her PCP to check her CBC twice yearly.  Our service is available in the future if her ANC <1; or her Plt <30K.  There is no significant increased risk of bleeding with plt >30K.   2. Psoriasis:  On OTC cream.  3. History of cirrhosis secondary to NASH:  She f/u with Dr Julieta Gutting.  4. Obesity:  I agree with her plan for gastric banding with Gen Surg.    Thank you for this referral.   The length of time of the face-to-face encounter was 15 minutes. More than 50% of time was spent counseling and coordination of care.

## 2012-01-18 ENCOUNTER — Other Ambulatory Visit: Payer: Self-pay | Admitting: Internal Medicine

## 2012-01-22 NOTE — Telephone Encounter (Signed)
Ok to RF? 

## 2012-01-27 ENCOUNTER — Other Ambulatory Visit: Payer: Self-pay | Admitting: Internal Medicine

## 2012-01-29 NOTE — Telephone Encounter (Signed)
Ok to Rf? 

## 2012-01-30 ENCOUNTER — Telehealth: Payer: Self-pay | Admitting: Internal Medicine

## 2012-01-30 NOTE — Telephone Encounter (Signed)
OK to fill this prescription with additional refills x0 Thank you!  

## 2012-01-30 NOTE — Telephone Encounter (Signed)
Received refill request from [facility] request refill for HYDROCODON-ACETAMINOPHEN 7.5-325mg  . Rx last written and pt last seen   . Please advise Thanks

## 2012-01-31 MED ORDER — HYDROCODONE-ACETAMINOPHEN 7.5-325 MG PO TABS: 1.0000 | ORAL_TABLET | Freq: Four times a day (QID) | ORAL | Status: DC | PRN

## 2012-01-31 NOTE — Telephone Encounter (Signed)
Meds Printed and faxed.

## 2012-02-01 ENCOUNTER — Ambulatory Visit (INDEPENDENT_AMBULATORY_CARE_PROVIDER_SITE_OTHER): Payer: BC Managed Care – PPO | Admitting: Internal Medicine

## 2012-02-01 ENCOUNTER — Encounter: Payer: Self-pay | Admitting: Internal Medicine

## 2012-02-01 ENCOUNTER — Other Ambulatory Visit (INDEPENDENT_AMBULATORY_CARE_PROVIDER_SITE_OTHER): Payer: BC Managed Care – PPO

## 2012-02-01 VITALS — BP 135/82 | HR 76 | Temp 98.1°F | Resp 16 | Wt 278.5 lb

## 2012-02-01 DIAGNOSIS — E039 Hypothyroidism, unspecified: Secondary | ICD-10-CM

## 2012-02-01 DIAGNOSIS — D693 Immune thrombocytopenic purpura: Secondary | ICD-10-CM

## 2012-02-01 DIAGNOSIS — D6959 Other secondary thrombocytopenia: Secondary | ICD-10-CM

## 2012-02-01 DIAGNOSIS — M545 Low back pain: Secondary | ICD-10-CM

## 2012-02-01 DIAGNOSIS — G4700 Insomnia, unspecified: Secondary | ICD-10-CM

## 2012-02-01 DIAGNOSIS — K7460 Unspecified cirrhosis of liver: Secondary | ICD-10-CM

## 2012-02-01 DIAGNOSIS — I10 Essential (primary) hypertension: Secondary | ICD-10-CM

## 2012-02-01 LAB — URINALYSIS
Leukocytes, UA: NEGATIVE
Nitrite: NEGATIVE
Specific Gravity, Urine: 1.015 (ref 1.000–1.030)
Urobilinogen, UA: 1 (ref 0.0–1.0)
pH: 6 (ref 5.0–8.0)

## 2012-02-01 NOTE — Assessment & Plan Note (Signed)
Continue with current prescription therapy as reflected on the Med list.  

## 2012-02-01 NOTE — Assessment & Plan Note (Signed)
Discussed.

## 2012-02-01 NOTE — Assessment & Plan Note (Signed)
monitoring

## 2012-02-01 NOTE — Progress Notes (Signed)
Patient ID: Lori Mills, female   DOB: 1950/06/27, 61 y.o.   MRN: [id number]   Subjective:    Patient ID: Lori Mills, female    DOB: 07-Aug-1950, 61 y.o.   MRN: [id number]  HPI   The patient is here to follow up on chronic psoriasis, depression, anxiety, LBP  and chronic moderate fibromyalgia symptoms partially controlled with medicines   BP Readings from Last 3 Encounters:  02/01/12 135/82  12/15/11 156/76  12/14/11 130/80   Wt Readings from Last 3 Encounters:  02/01/12 278 lb 8 oz (126.327 kg)  12/15/11 280 lb 3.2 oz (127.098 kg)  12/14/11 279 lb (126.554 kg)       Review of Systems  Constitutional: Positive for fatigue. Negative for chills, activity change, appetite change and unexpected weight change.  HENT: Negative for congestion, mouth sores and sinus pressure.   Eyes: Negative for visual disturbance.  Respiratory: Negative for cough and chest tightness.   Gastrointestinal: Negative for nausea and abdominal pain.  Genitourinary: Negative for frequency, difficulty urinating and vaginal pain.  Musculoskeletal: Negative for back pain and gait problem.  Skin: Positive for rash. Negative for pallor.  Neurological: Negative for dizziness, tremors, weakness, numbness and headaches.  Psychiatric/Behavioral: Positive for disturbed wake/sleep cycle. Negative for suicidal ideas and confusion. The patient is nervous/anxious.        Objective:   Physical Exam  Constitutional: She appears well-developed. No distress.       Obese  HENT:  Head: Normocephalic.  Right Ear: External ear normal.  Left Ear: External ear normal.  Nose: Nose normal.  Mouth/Throat: Oropharynx is clear and moist.  Eyes: Conjunctivae are normal. Pupils are equal, round, and reactive to light. Right eye exhibits no discharge. Left eye exhibits no discharge.  Neck: Normal range of motion. Neck supple. No JVD present. No tracheal deviation present. No thyromegaly present.  Cardiovascular: Normal  rate, regular rhythm and normal heart sounds.   Pulmonary/Chest: No stridor. No respiratory distress. She has no wheezes.  Abdominal: Soft. Bowel sounds are normal. She exhibits no distension and no mass. There is no tenderness. There is no rebound and no guarding.  Musculoskeletal: She exhibits no edema and no tenderness.  Lymphadenopathy:    She has no cervical adenopathy.  Neurological: She displays normal reflexes. No cranial nerve deficit. She exhibits normal muscle tone. Coordination normal.  Skin: Rash (extensive rashes) noted. No erythema.  Psychiatric: Her behavior is normal. Judgment and thought content normal.     Lab Results  Component Value Date   WBC 2.9* 12/15/2011   HGB 13.0 12/15/2011   HCT 38.9 12/15/2011   PLT 66* 12/15/2011   GLUCOSE 91 12/15/2011   CHOL 164 06/02/2010   TRIG 97.0 06/02/2010   HDL 43.50 06/02/2010   LDLCALC 101* 06/02/2010   ALT 15 12/15/2011   AST 27 12/15/2011   NA 141 12/15/2011   K 4.0 12/15/2011   CL 107 12/15/2011   CREATININE 0.65 12/15/2011   BUN 7 12/15/2011   CO2 28 12/15/2011   TSH 2.26 09/13/2011   INR 1.24 11/24/2009   HGBA1C 5.0 02/01/2009        Assessment & Plan:

## 2012-02-01 NOTE — Assessment & Plan Note (Signed)
Monitoring q3-4 mo 

## 2012-02-01 NOTE — Assessment & Plan Note (Signed)
Labs q 3-4 mo 

## 2012-02-22 ENCOUNTER — Telehealth: Payer: Self-pay

## 2012-02-22 DIAGNOSIS — D693 Immune thrombocytopenic purpura: Secondary | ICD-10-CM

## 2012-02-22 NOTE — Telephone Encounter (Signed)
Pt called requesting lab order to check her platelet count prior to 3 dental extractions, please advise.

## 2012-02-23 NOTE — Telephone Encounter (Signed)
OK CBC Thx

## 2012-02-27 NOTE — Telephone Encounter (Signed)
Labs ordered, pt informed via personal VM

## 2012-02-29 ENCOUNTER — Other Ambulatory Visit: Payer: Self-pay | Admitting: Internal Medicine

## 2012-03-01 NOTE — Telephone Encounter (Signed)
Ok to Rf? 

## 2012-03-05 ENCOUNTER — Other Ambulatory Visit: Payer: Self-pay | Admitting: Internal Medicine

## 2012-03-05 NOTE — Telephone Encounter (Signed)
Please advise. Ok to Rf?

## 2012-03-06 NOTE — Telephone Encounter (Signed)
Ok to Rf? 

## 2012-03-15 ENCOUNTER — Ambulatory Visit: Payer: BC Managed Care – PPO | Admitting: Internal Medicine

## 2012-03-27 ENCOUNTER — Other Ambulatory Visit (INDEPENDENT_AMBULATORY_CARE_PROVIDER_SITE_OTHER): Payer: BC Managed Care – PPO

## 2012-03-27 DIAGNOSIS — D693 Immune thrombocytopenic purpura: Secondary | ICD-10-CM

## 2012-03-27 LAB — CBC WITH DIFFERENTIAL/PLATELET
Basophils Relative: 0.5 % (ref 0.0–3.0)
Hemoglobin: 12.5 g/dL (ref 12.0–15.0)
Lymphocytes Relative: 23.8 % (ref 12.0–46.0)
Monocytes Relative: 6.8 % (ref 3.0–12.0)
Neutro Abs: 1.9 10*3/uL (ref 1.4–7.7)
Neutrophils Relative %: 64.7 % (ref 43.0–77.0)
RBC: 4.08 Mil/uL (ref 3.87–5.11)
WBC: 2.9 10*3/uL — ABNORMAL LOW (ref 4.5–10.5)

## 2012-04-16 ENCOUNTER — Other Ambulatory Visit: Payer: Self-pay | Admitting: Internal Medicine

## 2012-04-16 NOTE — Telephone Encounter (Signed)
Ok to Rf? 

## 2012-05-08 ENCOUNTER — Ambulatory Visit: Payer: BC Managed Care – PPO | Admitting: Internal Medicine

## 2012-05-13 ENCOUNTER — Ambulatory Visit: Payer: BC Managed Care – PPO | Admitting: Internal Medicine

## 2012-05-13 ENCOUNTER — Ambulatory Visit (INDEPENDENT_AMBULATORY_CARE_PROVIDER_SITE_OTHER): Payer: BC Managed Care – PPO | Admitting: Internal Medicine

## 2012-05-13 ENCOUNTER — Encounter: Payer: Self-pay | Admitting: Internal Medicine

## 2012-05-13 VITALS — BP 130/72 | HR 80 | Temp 97.1°F | Resp 16 | Wt 275.0 lb

## 2012-05-13 DIAGNOSIS — I10 Essential (primary) hypertension: Secondary | ICD-10-CM

## 2012-05-13 DIAGNOSIS — F419 Anxiety disorder, unspecified: Secondary | ICD-10-CM

## 2012-05-13 DIAGNOSIS — K7460 Unspecified cirrhosis of liver: Secondary | ICD-10-CM

## 2012-05-13 DIAGNOSIS — L409 Psoriasis, unspecified: Secondary | ICD-10-CM

## 2012-05-13 DIAGNOSIS — D693 Immune thrombocytopenic purpura: Secondary | ICD-10-CM

## 2012-05-13 DIAGNOSIS — L408 Other psoriasis: Secondary | ICD-10-CM

## 2012-05-13 DIAGNOSIS — Z23 Encounter for immunization: Secondary | ICD-10-CM

## 2012-05-13 DIAGNOSIS — F411 Generalized anxiety disorder: Secondary | ICD-10-CM

## 2012-05-13 DIAGNOSIS — M545 Low back pain: Secondary | ICD-10-CM

## 2012-05-13 NOTE — Assessment & Plan Note (Signed)
Continue with current prescription therapy as reflected on the Med list.  

## 2012-05-13 NOTE — Assessment & Plan Note (Signed)
She may be in the study

## 2012-05-13 NOTE — Assessment & Plan Note (Signed)
Monitoring

## 2012-05-13 NOTE — Progress Notes (Signed)
   Subjective:    Patient ID: Lori Mills, female    DOB: 10-28-1950, 61 y.o.   MRN: [id number]  HPI   The patient is here to follow up on chronic psoriasis, depression, anxiety, LBP  and chronic moderate fibromyalgia symptoms partially controlled with medicines. Her PLTs were at 74K w/her Derm   BP Readings from Last 3 Encounters:  05/13/12 130/72  02/01/12 135/82  12/15/11 156/76   Wt Readings from Last 3 Encounters:  05/13/12 275 lb (124.739 kg)  02/01/12 278 lb 8 oz (126.327 kg)  12/15/11 280 lb 3.2 oz (127.098 kg)       Review of Systems  Constitutional: Positive for fatigue. Negative for chills, activity change, appetite change and unexpected weight change.  HENT: Negative for congestion, mouth sores and sinus pressure.   Eyes: Negative for visual disturbance.  Respiratory: Negative for cough and chest tightness.   Gastrointestinal: Negative for nausea and abdominal pain.  Genitourinary: Negative for frequency, difficulty urinating and vaginal pain.  Musculoskeletal: Negative for back pain and gait problem.  Skin: Positive for rash. Negative for pallor.  Neurological: Negative for dizziness, tremors, weakness, numbness and headaches.  Psychiatric/Behavioral: Positive for sleep disturbance. Negative for suicidal ideas and confusion. The patient is nervous/anxious.        Objective:   Physical Exam  Constitutional: She appears well-developed. No distress.       Obese  HENT:  Head: Normocephalic.  Right Ear: External ear normal.  Left Ear: External ear normal.  Nose: Nose normal.  Mouth/Throat: Oropharynx is clear and moist.  Eyes: Conjunctivae normal are normal. Pupils are equal, round, and reactive to light. Right eye exhibits no discharge. Left eye exhibits no discharge.  Neck: Normal range of motion. Neck supple. No JVD present. No tracheal deviation present. No thyromegaly present.  Cardiovascular: Normal rate, regular rhythm and normal heart sounds.    Pulmonary/Chest: No stridor. No respiratory distress. She has no wheezes.  Abdominal: Soft. Bowel sounds are normal. She exhibits no distension and no mass. There is no tenderness. There is no rebound and no guarding.  Musculoskeletal: She exhibits no edema and no tenderness.  Lymphadenopathy:    She has no cervical adenopathy.  Neurological: She displays normal reflexes. No cranial nerve deficit. She exhibits normal muscle tone. Coordination normal.  Skin: Rash (extensive rashes) noted. No erythema.  Psychiatric: Her behavior is normal. Judgment and thought content normal.     Lab Results  Component Value Date   WBC 2.9* 03/27/2012   HGB 12.5 03/27/2012   HCT 37.3 03/27/2012   PLT 70.0* 03/27/2012   GLUCOSE 91 12/15/2011   CHOL 164 06/02/2010   TRIG 97.0 06/02/2010   HDL 43.50 06/02/2010   LDLCALC 101* 06/02/2010   ALT 15 12/15/2011   AST 27 12/15/2011   NA 141 12/15/2011   K 4.0 12/15/2011   CL 107 12/15/2011   CREATININE 0.65 12/15/2011   BUN 7 12/15/2011   CO2 28 12/15/2011   TSH 2.26 09/13/2011   INR 1.24 11/24/2009   HGBA1C 5.0 02/01/2009        Assessment & Plan:

## 2012-05-30 ENCOUNTER — Other Ambulatory Visit: Payer: Self-pay | Admitting: Internal Medicine

## 2012-05-30 NOTE — Telephone Encounter (Signed)
Ok to Rf in PCP absence? 

## 2012-05-30 NOTE — Telephone Encounter (Signed)
Lori Mills, OK to refill. [name]

## 2012-07-10 ENCOUNTER — Telehealth: Payer: Self-pay | Admitting: *Deleted

## 2012-07-10 MED ORDER — HYDROCODONE-ACETAMINOPHEN 7.5-325 MG PO TABS: 1.0000 | ORAL_TABLET | ORAL | Status: DC | PRN

## 2012-07-10 NOTE — Telephone Encounter (Signed)
Done

## 2012-07-10 NOTE — Telephone Encounter (Signed)
OK to fill this prescription with additional refills x2 Thank you!  

## 2012-07-10 NOTE — Telephone Encounter (Signed)
Rf req for Hydroco/APAP 7.5-325 1 po q 4 prn pain. Last filled 05/30/12. Ok to Rf?

## 2012-07-25 ENCOUNTER — Encounter: Payer: Self-pay | Admitting: Internal Medicine

## 2012-07-25 ENCOUNTER — Ambulatory Visit (INDEPENDENT_AMBULATORY_CARE_PROVIDER_SITE_OTHER): Payer: BC Managed Care – PPO | Admitting: Internal Medicine

## 2012-07-25 ENCOUNTER — Other Ambulatory Visit (INDEPENDENT_AMBULATORY_CARE_PROVIDER_SITE_OTHER): Payer: BC Managed Care – PPO

## 2012-07-25 ENCOUNTER — Telehealth: Payer: Self-pay | Admitting: Internal Medicine

## 2012-07-25 ENCOUNTER — Other Ambulatory Visit: Payer: Self-pay | Admitting: Internal Medicine

## 2012-07-25 VITALS — BP 150/70 | HR 76 | Temp 97.2°F | Resp 16 | Wt 271.0 lb

## 2012-07-25 DIAGNOSIS — F419 Anxiety disorder, unspecified: Secondary | ICD-10-CM

## 2012-07-25 DIAGNOSIS — E039 Hypothyroidism, unspecified: Secondary | ICD-10-CM

## 2012-07-25 DIAGNOSIS — R160 Hepatomegaly, not elsewhere classified: Secondary | ICD-10-CM

## 2012-07-25 DIAGNOSIS — F411 Generalized anxiety disorder: Secondary | ICD-10-CM

## 2012-07-25 DIAGNOSIS — I10 Essential (primary) hypertension: Secondary | ICD-10-CM

## 2012-07-25 DIAGNOSIS — R300 Dysuria: Secondary | ICD-10-CM

## 2012-07-25 DIAGNOSIS — R17 Unspecified jaundice: Secondary | ICD-10-CM

## 2012-07-25 LAB — CBC WITH DIFFERENTIAL/PLATELET
Basophils Absolute: 0 10*3/uL (ref 0.0–0.1)
Eosinophils Absolute: 0.1 10*3/uL (ref 0.0–0.7)
Hemoglobin: 12.1 g/dL (ref 12.0–15.0)
Lymphocytes Relative: 22.3 % (ref 12.0–46.0)
MCHC: 33.8 g/dL (ref 30.0–36.0)
Monocytes Relative: 5.8 % (ref 3.0–12.0)
Neutro Abs: 1.9 10*3/uL (ref 1.4–7.7)
Neutrophils Relative %: 67.5 % (ref 43.0–77.0)
RDW: 14.8 % — ABNORMAL HIGH (ref 11.5–14.6)

## 2012-07-25 LAB — URINALYSIS
Bilirubin Urine: NEGATIVE
Ketones, ur: NEGATIVE
Leukocytes, UA: NEGATIVE
Urine Glucose: NEGATIVE
Urobilinogen, UA: 0.2 (ref 0.0–1.0)
pH: 5.5 (ref 5.0–8.0)

## 2012-07-25 LAB — BASIC METABOLIC PANEL
CO2: 30 mEq/L (ref 19–32)
Calcium: 9.1 mg/dL (ref 8.4–10.5)
Creatinine, Ser: 0.7 mg/dL (ref 0.4–1.2)
Glucose, Bld: 160 mg/dL — ABNORMAL HIGH (ref 70–99)

## 2012-07-25 LAB — HEPATIC FUNCTION PANEL
ALT: 19 U/L (ref 0–35)
Albumin: 3.1 g/dL — ABNORMAL LOW (ref 3.5–5.2)
Alkaline Phosphatase: 116 U/L (ref 39–117)
Total Protein: 6.4 g/dL (ref 6.0–8.3)

## 2012-07-25 LAB — TSH: TSH: 0.93 u[IU]/mL (ref 0.35–5.50)

## 2012-07-25 MED ORDER — CIPROFLOXACIN HCL 250 MG PO TABS: 250.0000 mg | ORAL_TABLET | Freq: Two times a day (BID) | ORAL | Status: DC

## 2012-07-25 NOTE — Assessment & Plan Note (Signed)
TSH 

## 2012-07-25 NOTE — Assessment & Plan Note (Signed)
Continue with current prescription therapy as reflected on the Med list.  

## 2012-07-25 NOTE — Progress Notes (Signed)
   Subjective:     Abdominal Pain This is a new problem. The onset quality is gradual. The pain is located in the suprapubic region. The pain is mild. The quality of the pain is dull and aching. Associated symptoms include dysuria. Pertinent negatives include no frequency, headaches or nausea. Associated symptoms comments: Dark urine. The pain is relieved by nothing.     The patient is here to follow up on chronic psoriasis, depression, anxiety, LBP  and chronic moderate fibromyalgia symptoms partially controlled with medicines. Her PLTs were at 74K w/her Derm   BP Readings from Last 3 Encounters:  07/25/12 150/70  05/13/12 130/72  02/01/12 135/82   Wt Readings from Last 3 Encounters:  07/25/12 271 lb (122.925 kg)  05/13/12 275 lb (124.739 kg)  02/01/12 278 lb 8 oz (126.327 kg)       Review of Systems  Constitutional: Positive for fatigue. Negative for chills, activity change, appetite change and unexpected weight change.  HENT: Negative for congestion, mouth sores and sinus pressure.   Eyes: Negative for visual disturbance.  Respiratory: Negative for cough and chest tightness.   Gastrointestinal: Positive for abdominal pain. Negative for nausea.  Genitourinary: Positive for dysuria. Negative for frequency, difficulty urinating and vaginal pain.  Musculoskeletal: Positive for back pain. Negative for gait problem.  Skin: Positive for rash. Negative for pallor.  Neurological: Negative for dizziness, tremors, weakness, numbness and headaches.  Psychiatric/Behavioral: Positive for sleep disturbance. Negative for suicidal ideas and confusion. The patient is nervous/anxious.        Objective:   Physical Exam  Constitutional: She appears well-developed. No distress.  Obese  HENT:  Head: Normocephalic.  Right Ear: External ear normal.  Left Ear: External ear normal.  Nose: Nose normal.  Mouth/Throat: Oropharynx is clear and moist.  Eyes: Conjunctivae are normal. Pupils are  equal, round, and reactive to light. Right eye exhibits no discharge. Left eye exhibits no discharge.  Neck: Normal range of motion. Neck supple. No JVD present. No tracheal deviation present. No thyromegaly present.  Cardiovascular: Normal rate, regular rhythm and normal heart sounds.   Pulmonary/Chest: No stridor. No respiratory distress. She has no wheezes. She exhibits no tenderness.  Abdominal: Soft. Bowel sounds are normal. She exhibits no distension and no mass. There is no tenderness. There is no rebound and no guarding.  Musculoskeletal: She exhibits no edema and no tenderness.  Lymphadenopathy:    She has no cervical adenopathy.  Neurological: She displays normal reflexes. No cranial nerve deficit. She exhibits normal muscle tone. Coordination normal.  Skin: Rash (extensive rashes) noted. No erythema.  Psychiatric: Her behavior is normal. Judgment and thought content normal.     Lab Results  Component Value Date   WBC 2.9* 03/27/2012   HGB 12.5 03/27/2012   HCT 37.3 03/27/2012   PLT 70.0* 03/27/2012   GLUCOSE 91 12/15/2011   CHOL 164 06/02/2010   TRIG 97.0 06/02/2010   HDL 43.50 06/02/2010   LDLCALC 101* 06/02/2010   ALT 15 12/15/2011   AST 27 12/15/2011   NA 141 12/15/2011   K 4.0 12/15/2011   CL 107 12/15/2011   CREATININE 0.65 12/15/2011   BUN 7 12/15/2011   CO2 28 12/15/2011   TSH 2.26 09/13/2011   INR 1.24 11/24/2009   HGBA1C 5.0 02/01/2009        Assessment & Plan:

## 2012-07-25 NOTE — Assessment & Plan Note (Signed)
Wt Readings from Last 3 Encounters:  07/25/12 271 lb (122.925 kg)  05/13/12 275 lb (124.739 kg)  02/01/12 278 lb 8 oz (126.327 kg)

## 2012-07-25 NOTE — Assessment & Plan Note (Signed)
Labs Cipro if UTI

## 2012-07-25 NOTE — Assessment & Plan Note (Signed)
Labs

## 2012-07-25 NOTE — Telephone Encounter (Signed)
Lori Mills, please, inform patient that her urine was dark due to elev billirubin. UA was ok Repeat LFTs, CBC in 10 d Thx

## 2012-07-25 NOTE — Assessment & Plan Note (Signed)
BP Readings from Last 3 Encounters:  07/25/12 150/70  05/13/12 130/72  02/01/12 135/82

## 2012-07-26 NOTE — Telephone Encounter (Signed)
Left detailed mess on mobile informing pt of below.

## 2012-07-29 ENCOUNTER — Other Ambulatory Visit: Payer: Self-pay | Admitting: Internal Medicine

## 2012-08-13 ENCOUNTER — Ambulatory Visit: Payer: BC Managed Care – PPO | Admitting: Internal Medicine

## 2012-08-13 ENCOUNTER — Other Ambulatory Visit (INDEPENDENT_AMBULATORY_CARE_PROVIDER_SITE_OTHER): Payer: BC Managed Care – PPO

## 2012-08-13 DIAGNOSIS — R17 Unspecified jaundice: Secondary | ICD-10-CM

## 2012-08-13 LAB — CBC WITH DIFFERENTIAL/PLATELET
Basophils Absolute: 0 10*3/uL (ref 0.0–0.1)
Eosinophils Absolute: 0.1 10*3/uL (ref 0.0–0.7)
Lymphocytes Relative: 27.1 % (ref 12.0–46.0)
MCHC: 33.8 g/dL (ref 30.0–36.0)
Neutrophils Relative %: 61.6 % (ref 43.0–77.0)
RDW: 15 % — ABNORMAL HIGH (ref 11.5–14.6)

## 2012-08-13 LAB — HEPATIC FUNCTION PANEL
Alkaline Phosphatase: 119 U/L — ABNORMAL HIGH (ref 39–117)
Bilirubin, Direct: 0.4 mg/dL — ABNORMAL HIGH (ref 0.0–0.3)

## 2012-08-16 ENCOUNTER — Encounter: Payer: Self-pay | Admitting: Internal Medicine

## 2012-08-16 ENCOUNTER — Ambulatory Visit (INDEPENDENT_AMBULATORY_CARE_PROVIDER_SITE_OTHER): Payer: BC Managed Care – PPO | Admitting: Internal Medicine

## 2012-08-16 VITALS — BP 120/60 | HR 76 | Temp 98.0°F | Resp 16 | Wt 266.0 lb

## 2012-08-16 DIAGNOSIS — R160 Hepatomegaly, not elsewhere classified: Secondary | ICD-10-CM

## 2012-08-16 DIAGNOSIS — R609 Edema, unspecified: Secondary | ICD-10-CM

## 2012-08-16 DIAGNOSIS — L409 Psoriasis, unspecified: Secondary | ICD-10-CM

## 2012-08-16 DIAGNOSIS — L408 Other psoriasis: Secondary | ICD-10-CM

## 2012-08-16 DIAGNOSIS — K7460 Unspecified cirrhosis of liver: Secondary | ICD-10-CM

## 2012-08-16 DIAGNOSIS — D6959 Other secondary thrombocytopenia: Secondary | ICD-10-CM

## 2012-08-16 DIAGNOSIS — I10 Essential (primary) hypertension: Secondary | ICD-10-CM

## 2012-08-16 MED ORDER — TRIAMCINOLONE ACETONIDE 0.5 % EX CREA: TOPICAL_CREAM | Freq: Three times a day (TID) | CUTANEOUS | Status: DC

## 2012-08-16 NOTE — Assessment & Plan Note (Signed)
Continue with current prescription therapy as reflected on the Med list.  

## 2012-08-16 NOTE — Assessment & Plan Note (Signed)
Chronic due to cirrhosis GI at [facility] - had an MRI 2013 Diet/wt loss

## 2012-08-16 NOTE — Assessment & Plan Note (Signed)
Cont w/wt loss/diet

## 2012-08-16 NOTE — Assessment & Plan Note (Signed)
Monitoring

## 2012-08-16 NOTE — Progress Notes (Signed)
    Subjective:     Abdominal Pain This is a new problem. The onset quality is gradual. The pain is located in the suprapubic region. The pain is mild. The quality of the pain is dull and aching. Associated symptoms include dysuria. Pertinent negatives include no frequency, headaches or nausea. Associated symptoms comments: Dark urine. The pain is relieved by nothing.     The patient is here to follow up on chronic psoriasis, depression, anxiety, LBP  and chronic moderate fibromyalgia symptoms partially controlled with medicines. Her PLTs were at 74K w/her Derm   BP Readings from Last 3 Encounters:  08/16/12 120/60  07/25/12 150/70  05/13/12 130/72   Wt Readings from Last 3 Encounters:  08/16/12 266 lb (120.657 kg)  07/25/12 271 lb (122.925 kg)  05/13/12 275 lb (124.739 kg)       Review of Systems  Constitutional: Positive for fatigue. Negative for chills, activity change, appetite change and unexpected weight change.  HENT: Negative for congestion, mouth sores and sinus pressure.   Eyes: Negative for visual disturbance.  Respiratory: Negative for cough and chest tightness.   Gastrointestinal: Positive for abdominal pain. Negative for nausea.  Genitourinary: Positive for dysuria. Negative for frequency, difficulty urinating and vaginal pain.  Musculoskeletal: Positive for back pain. Negative for gait problem.  Skin: Positive for rash. Negative for pallor.  Neurological: Negative for dizziness, tremors, weakness, numbness and headaches.  Psychiatric/Behavioral: Positive for sleep disturbance. Negative for suicidal ideas and confusion. The patient is nervous/anxious.        Objective:   Physical Exam  Constitutional: She appears well-developed. No distress.  Obese  HENT:  Head: Normocephalic.  Right Ear: External ear normal.  Left Ear: External ear normal.  Nose: Nose normal.  Mouth/Throat: Oropharynx is clear and moist.  Eyes: Conjunctivae are normal. Pupils are  equal, round, and reactive to light. Right eye exhibits no discharge. Left eye exhibits no discharge.  Neck: Normal range of motion. Neck supple. No JVD present. No tracheal deviation present. No thyromegaly present.  Cardiovascular: Normal rate, regular rhythm and normal heart sounds.   Pulmonary/Chest: No stridor. No respiratory distress. She has no wheezes. She exhibits no tenderness.  Abdominal: Soft. Bowel sounds are normal. She exhibits no distension and no mass. There is no tenderness. There is no rebound and no guarding.  Musculoskeletal: She exhibits no edema and no tenderness.  Lymphadenopathy:    She has no cervical adenopathy.  Neurological: She displays normal reflexes. No cranial nerve deficit. She exhibits normal muscle tone. Coordination normal.  Skin: Rash (extensive rashes) noted. No erythema.  Psychiatric: Her behavior is normal. Judgment and thought content normal.     Lab Results  Component Value Date   WBC 3.0* 08/13/2012   HGB 12.6 08/13/2012   HCT 37.3 08/13/2012   PLT 68.0* 08/13/2012   GLUCOSE 160* 07/25/2012   CHOL 164 06/02/2010   TRIG 97.0 06/02/2010   HDL 43.50 06/02/2010   LDLCALC 101* 06/02/2010   ALT 21 08/13/2012   AST 30 08/13/2012   NA 143 07/25/2012   K 4.1 07/25/2012   CL 109 07/25/2012   CREATININE 0.7 07/25/2012   BUN 7 07/25/2012   CO2 30 07/25/2012   TSH 0.93 07/25/2012   INR 1.24 11/24/2009   HGBA1C 5.0 02/01/2009        Assessment & Plan:

## 2012-08-16 NOTE — Assessment & Plan Note (Signed)
Chronic in B LE

## 2012-09-11 ENCOUNTER — Other Ambulatory Visit (INDEPENDENT_AMBULATORY_CARE_PROVIDER_SITE_OTHER): Payer: BC Managed Care – PPO

## 2012-09-11 ENCOUNTER — Encounter: Payer: Self-pay | Admitting: Internal Medicine

## 2012-09-11 ENCOUNTER — Ambulatory Visit (INDEPENDENT_AMBULATORY_CARE_PROVIDER_SITE_OTHER): Payer: BC Managed Care – PPO | Admitting: Internal Medicine

## 2012-09-11 DIAGNOSIS — L03116 Cellulitis of left lower limb: Secondary | ICD-10-CM | POA: Insufficient documentation

## 2012-09-11 DIAGNOSIS — I872 Venous insufficiency (chronic) (peripheral): Secondary | ICD-10-CM

## 2012-09-11 DIAGNOSIS — K7460 Unspecified cirrhosis of liver: Secondary | ICD-10-CM

## 2012-09-11 DIAGNOSIS — L03119 Cellulitis of unspecified part of limb: Secondary | ICD-10-CM

## 2012-09-11 DIAGNOSIS — L02419 Cutaneous abscess of limb, unspecified: Secondary | ICD-10-CM

## 2012-09-11 LAB — TSH: TSH: 1.54 u[IU]/mL (ref 0.35–5.50)

## 2012-09-11 MED ORDER — TRIAMCINOLONE ACETONIDE 0.5 % EX CREA: TOPICAL_CREAM | Freq: Three times a day (TID) | CUTANEOUS | Status: DC

## 2012-09-11 MED ORDER — DOXYCYCLINE HYCLATE 100 MG PO TABS: 100.0000 mg | ORAL_TABLET | Freq: Two times a day (BID) | ORAL | Status: DC

## 2012-09-11 MED ORDER — FUROSEMIDE 40 MG PO TABS: 40.0000 mg | ORAL_TABLET | ORAL | Status: DC | PRN

## 2012-09-11 NOTE — Assessment & Plan Note (Signed)
LFTs in 2 wks

## 2012-09-11 NOTE — Assessment & Plan Note (Signed)
Doxy  We may need to check LFTs in a couple wks

## 2012-09-11 NOTE — Assessment & Plan Note (Signed)
Discussed again 

## 2012-09-11 NOTE — Progress Notes (Signed)
Patient ID: Lori Mills, female   DOB: 10-19-1950, 62 y.o.   MRN: [id number]    Subjective:     Leg Pain  The incident occurred more than 1 week ago. There was no injury mechanism. The pain is present in the left leg. The quality of the pain is described as burning. The pain is moderate. Pertinent negatives include no inability to bear weight or numbness. The treatment provided no relief.     The patient is here to follow up on chronic psoriasis, depression, anxiety, LBP  and chronic moderate fibromyalgia symptoms partially controlled with medicines. Her PLTs were at 74K w/her Derm   BP Readings from Last 3 Encounters:  09/11/12 120/70  08/16/12 120/60  07/25/12 150/70   Wt Readings from Last 3 Encounters:  09/11/12 272 lb (123.378 kg)  08/16/12 266 lb (120.657 kg)  07/25/12 271 lb (122.925 kg)       Review of Systems  Constitutional: Positive for fatigue. Negative for chills, activity change, appetite change and unexpected weight change.  HENT: Negative for congestion, mouth sores and sinus pressure.   Eyes: Negative for visual disturbance.  Respiratory: Negative for cough and chest tightness.   Genitourinary: Negative for difficulty urinating and vaginal pain.  Musculoskeletal: Positive for back pain. Negative for gait problem.  Skin: Positive for rash. Negative for pallor.  Neurological: Negative for dizziness, tremors, weakness and numbness.  Psychiatric/Behavioral: Positive for sleep disturbance. Negative for suicidal ideas and confusion. The patient is nervous/anxious.        Objective:   Physical Exam  Constitutional: She appears well-developed. No distress.  Obese  HENT:  Head: Normocephalic.  Right Ear: External ear normal.  Left Ear: External ear normal.  Nose: Nose normal.  Mouth/Throat: Oropharynx is clear and moist.  Eyes: Conjunctivae are normal. Pupils are equal, round, and reactive to light. Right eye exhibits no discharge. Left eye exhibits no  discharge.  Neck: Normal range of motion. Neck supple. No JVD present. No tracheal deviation present. No thyromegaly present.  Cardiovascular: Normal rate, regular rhythm and normal heart sounds.   Pulmonary/Chest: No stridor. No respiratory distress. She has no wheezes. She exhibits no tenderness.  Abdominal: Soft. Bowel sounds are normal. She exhibits no distension and no mass. There is no tenderness. There is no rebound and no guarding.  Musculoskeletal: She exhibits no edema and no tenderness.  Lymphadenopathy:    She has no cervical adenopathy.  Neurological: She displays normal reflexes. No cranial nerve deficit. She exhibits normal muscle tone. Coordination normal.  Skin: Rash (extensive rashes) noted. No erythema.  Hyperpigmented sollen distal shins L inner dist shin w/erythema, tener  Psychiatric: Her behavior is normal. Judgment and thought content normal.     Lab Results  Component Value Date   WBC 3.0* 08/13/2012   HGB 12.6 08/13/2012   HCT 37.3 08/13/2012   PLT 68.0* 08/13/2012   GLUCOSE 160* 07/25/2012   CHOL 164 06/02/2010   TRIG 97.0 06/02/2010   HDL 43.50 06/02/2010   LDLCALC 101* 06/02/2010   ALT 21 08/13/2012   AST 30 08/13/2012   NA 143 07/25/2012   K 4.1 07/25/2012   CL 109 07/25/2012   CREATININE 0.7 07/25/2012   BUN 7 07/25/2012   CO2 30 07/25/2012   TSH 0.93 07/25/2012   INR 1.24 11/24/2009   HGBA1C 5.0 02/01/2009        Assessment & Plan:

## 2012-09-11 NOTE — Assessment & Plan Note (Signed)
4/14 distal shin - poss early stasis ulcer formation Loose wt Elevate LE Kenalog tid

## 2012-10-16 ENCOUNTER — Ambulatory Visit (INDEPENDENT_AMBULATORY_CARE_PROVIDER_SITE_OTHER): Payer: BC Managed Care – PPO | Admitting: Internal Medicine

## 2012-10-16 ENCOUNTER — Encounter: Payer: Self-pay | Admitting: Internal Medicine

## 2012-10-16 DIAGNOSIS — J019 Acute sinusitis, unspecified: Secondary | ICD-10-CM

## 2012-10-16 DIAGNOSIS — J209 Acute bronchitis, unspecified: Secondary | ICD-10-CM

## 2012-10-16 MED ORDER — HYDROCODONE-ACETAMINOPHEN 7.5-325 MG PO TABS: 1.0000 | ORAL_TABLET | ORAL | Status: DC | PRN

## 2012-10-16 MED ORDER — LEVOFLOXACIN 500 MG PO TABS: 500.0000 mg | ORAL_TABLET | Freq: Every day | ORAL | Status: DC

## 2012-10-16 MED ORDER — PROMETHAZINE-CODEINE 6.25-10 MG/5ML PO SYRP: 5.0000 mL | ORAL_SOLUTION | ORAL | Status: DC | PRN

## 2012-10-16 NOTE — Assessment & Plan Note (Signed)
Better a little 

## 2012-10-16 NOTE — Progress Notes (Signed)
    Subjective:     Cough This is a new problem. The current episode started in the past 7 days. The problem has been gradually worsening. The problem occurs every few minutes. The cough is productive of brown sputum. Associated symptoms include a rash. Pertinent negatives include no chills.  Sinusitis This is a new problem. The current episode started in the past 7 days. Associated symptoms include coughing. Pertinent negatives include no chills, congestion or sinus pressure.     The patient is here to follow up on chronic psoriasis, depression, anxiety, LBP  and chronic moderate fibromyalgia symptoms partially controlled with medicines. Her PLTs were at 74K w/her Derm   BP Readings from Last 3 Encounters:  10/16/12 144/60  09/11/12 120/70  08/16/12 120/60   Wt Readings from Last 3 Encounters:  10/16/12 271 lb (122.925 kg)  09/11/12 272 lb (123.378 kg)  08/16/12 266 lb (120.657 kg)       Review of Systems  Constitutional: Positive for fatigue. Negative for chills, activity change, appetite change and unexpected weight change.  HENT: Negative for congestion, mouth sores and sinus pressure.   Eyes: Negative for visual disturbance.  Respiratory: Positive for cough. Negative for chest tightness.   Genitourinary: Negative for difficulty urinating and vaginal pain.  Musculoskeletal: Positive for back pain. Negative for gait problem.  Skin: Positive for rash. Negative for pallor.  Neurological: Negative for dizziness, tremors and weakness.  Psychiatric/Behavioral: Positive for sleep disturbance. Negative for suicidal ideas and confusion. The patient is nervous/anxious.        Objective:   Physical Exam  Constitutional: She appears well-developed. No distress.  Obese  HENT:  Head: Normocephalic.  Right Ear: External ear normal.  Left Ear: External ear normal.  Nose: Nose normal.  Mouth/Throat: Oropharynx is clear and moist.  Eyes: Conjunctivae are normal. Pupils are  equal, round, and reactive to light. Right eye exhibits no discharge. Left eye exhibits no discharge.  Neck: Normal range of motion. Neck supple. No JVD present. No tracheal deviation present. No thyromegaly present.  Cardiovascular: Normal rate, regular rhythm and normal heart sounds.   Pulmonary/Chest: No stridor. No respiratory distress. She has no wheezes. She exhibits no tenderness.  Abdominal: Soft. Bowel sounds are normal. She exhibits no distension and no mass. There is no tenderness. There is no rebound and no guarding.  Musculoskeletal: She exhibits no edema and no tenderness.  Lymphadenopathy:    She has no cervical adenopathy.  Neurological: She displays normal reflexes. No cranial nerve deficit. She exhibits normal muscle tone. Coordination normal.  Skin: Rash (extensive rashes) noted. No erythema.  Hyperpigmented sollen distal shins L inner dist shin w/erythema, tener  Psychiatric: Her behavior is normal. Judgment and thought content normal.  eryth throat Coughing a lot   Lab Results  Component Value Date   WBC 3.0* 08/13/2012   HGB 12.6 08/13/2012   HCT 37.3 08/13/2012   PLT 68.0* 08/13/2012   GLUCOSE 160* 07/25/2012   CHOL 164 06/02/2010   TRIG 97.0 06/02/2010   HDL 43.50 06/02/2010   LDLCALC 101* 06/02/2010   ALT 21 08/13/2012   AST 30 08/13/2012   NA 143 07/25/2012   K 4.1 07/25/2012   CL 109 07/25/2012   CREATININE 0.7 07/25/2012   BUN 7 07/25/2012   CO2 30 07/25/2012   TSH 1.54 09/11/2012   INR 1.24 11/24/2009   HGBA1C 5.0 02/01/2009        Assessment & Plan:

## 2012-10-16 NOTE — Assessment & Plan Note (Signed)
Prom-cod syr prn 

## 2012-10-16 NOTE — Assessment & Plan Note (Signed)
levaquin x 10 d

## 2012-10-23 ENCOUNTER — Ambulatory Visit: Payer: BC Managed Care – PPO | Admitting: Internal Medicine

## 2012-11-26 ENCOUNTER — Ambulatory Visit: Payer: BC Managed Care – PPO | Admitting: Internal Medicine

## 2012-12-02 ENCOUNTER — Other Ambulatory Visit (INDEPENDENT_AMBULATORY_CARE_PROVIDER_SITE_OTHER): Payer: BC Managed Care – PPO

## 2012-12-02 DIAGNOSIS — L409 Psoriasis, unspecified: Secondary | ICD-10-CM

## 2012-12-02 DIAGNOSIS — D6959 Other secondary thrombocytopenia: Secondary | ICD-10-CM

## 2012-12-02 DIAGNOSIS — R609 Edema, unspecified: Secondary | ICD-10-CM

## 2012-12-02 DIAGNOSIS — I10 Essential (primary) hypertension: Secondary | ICD-10-CM

## 2012-12-02 DIAGNOSIS — R160 Hepatomegaly, not elsewhere classified: Secondary | ICD-10-CM

## 2012-12-02 DIAGNOSIS — K7460 Unspecified cirrhosis of liver: Secondary | ICD-10-CM

## 2012-12-02 DIAGNOSIS — L408 Other psoriasis: Secondary | ICD-10-CM

## 2012-12-02 LAB — CBC WITH DIFFERENTIAL/PLATELET
Basophils Absolute: 0 10*3/uL (ref 0.0–0.1)
HCT: 36.1 % (ref 36.0–46.0)
Lymphs Abs: 0.6 10*3/uL — ABNORMAL LOW (ref 0.7–4.0)
Monocytes Relative: 7.6 % (ref 3.0–12.0)
Neutrophils Relative %: 67.2 % (ref 43.0–77.0)
Platelets: 68 10*3/uL — ABNORMAL LOW (ref 150.0–400.0)
RDW: 15 % — ABNORMAL HIGH (ref 11.5–14.6)

## 2012-12-02 LAB — BASIC METABOLIC PANEL
Chloride: 109 mEq/L (ref 96–112)
Creatinine, Ser: 0.7 mg/dL (ref 0.4–1.2)
Potassium: 3.8 mEq/L (ref 3.5–5.1)
Sodium: 140 mEq/L (ref 135–145)

## 2012-12-02 LAB — HEMOGLOBIN A1C: Hgb A1c MFr Bld: 4.8 % (ref 4.6–6.5)

## 2012-12-03 ENCOUNTER — Encounter: Payer: Self-pay | Admitting: Internal Medicine

## 2012-12-03 ENCOUNTER — Ambulatory Visit (INDEPENDENT_AMBULATORY_CARE_PROVIDER_SITE_OTHER): Payer: BC Managed Care – PPO | Admitting: Internal Medicine

## 2012-12-03 VITALS — BP 140/62 | HR 72 | Temp 97.9°F | Resp 16 | Wt 271.0 lb

## 2012-12-03 DIAGNOSIS — L408 Other psoriasis: Secondary | ICD-10-CM

## 2012-12-03 DIAGNOSIS — M545 Low back pain: Secondary | ICD-10-CM

## 2012-12-03 DIAGNOSIS — E039 Hypothyroidism, unspecified: Secondary | ICD-10-CM

## 2012-12-03 DIAGNOSIS — F329 Major depressive disorder, single episode, unspecified: Secondary | ICD-10-CM

## 2012-12-03 DIAGNOSIS — K7460 Unspecified cirrhosis of liver: Secondary | ICD-10-CM

## 2012-12-03 DIAGNOSIS — L409 Psoriasis, unspecified: Secondary | ICD-10-CM

## 2012-12-03 DIAGNOSIS — I10 Essential (primary) hypertension: Secondary | ICD-10-CM

## 2012-12-03 MED ORDER — TRIAMCINOLONE ACETONIDE 0.5 % EX OINT: TOPICAL_OINTMENT | Freq: Two times a day (BID) | CUTANEOUS | Status: DC

## 2012-12-03 NOTE — Assessment & Plan Note (Signed)
Continue with current prescription therapy as reflected on the Med list.  

## 2012-12-03 NOTE — Assessment & Plan Note (Signed)
Watching labs 

## 2012-12-03 NOTE — Assessment & Plan Note (Signed)
Continue current medication regimen

## 2012-12-03 NOTE — Assessment & Plan Note (Signed)
Triamc oint Rx

## 2012-12-03 NOTE — Progress Notes (Signed)
Subjective:    Patient ID: Lori Mills, female    DOB: 1951-01-01, 62 y.o.   MRN: [id number]  HPI Patient presents for a follow up of chronic medical issues  Cough/Sinusitis Improved since last seen. Reports runny nose and itchy eyes with outdoor exposure occasionally however is well controlled with fluticasone and loratadine.  Cellulitis Improved since last seen in the office. Still notes soreness on medial aspect of left leg from mid tibial to malleolar region. Denies warmth in the area or associated fevers.  Psoriasis Currently experiencing a "flare" encompassing her right arm, left occipital region of the scalp as well as diffusely across her mediastinum. Psoriasis is a typical presentation for the patient, reports being pruritic and scaly. Requests a refill on her triamcinolone cream.   HTN Patient notes that she forgot to take her antihypertensives today which is why her BP is elevated at this visit. Reports otherwise being controlled with values not exceeding the mid 130's systolic. Patient reports good compliance with medications and has not seen any medication adverse effects. Denies headaches or scotomata.   Thromobocytopenia Blood work being monitored by PCP every 3-6 months. Last values drawn 12/02/12 showed a platelet count of 68.0. Denies bleeding or easy bruising.  Cirrhosis Continued to be monitored. Reports no RUQ pain or jaundice. Patient began drinking protein shakes to help aid weight loss, however stopped until further clearance from a physician due to the protein content. Wishes to discuss this at today's visit. States that her last MRI showed cirrhosis, however the "liver spots" had dissipated.   Obesity Patient reports portion control and healthier eating habits stating she only eats chicken and vegetables and still has not seen any weight loss. States that she is unable to exercise due to joint pain and embarrassment over her psoriasis. Mentioned bariatric  surgery, however feel as though it would be dangerous for her considering her age, weight and liver function.  Review of Systems HEENT: recent blurred vision for which she attributes to age, rhinorrhea with outdoor exposure. Denies scotomata, headache Cardiac: denies chest pain, chest tightness, palpitations, tachycardia Pulmonary: denies SOB, cough GI: recent decrease in appetite with no associated weigh loss. No changes in bowel or bladder habits, denies melena, hematochezia, hematuria, dysuria. Neuro: reports occasional "carpal tunnel symptoms" with overuse, denies paresthesias otherwise    Objective:   Physical Exam General: well developed, obese female, sitting comfortably in the room. Red raised scaly patches noted along mediastinum, right arm and left hand Cardiac: RRR, no MGR Pulmonary: unlabored breathing, lungs clear to A&P Neuro: PERRLA Extremities: diffuse psoriasis patches across scalp, right arm, left hand and bilateral legs. +2 radial pulses with normal capillary refill. +2 pitting edema in bilateral legs with thickening of skin and brownish tint consistent with venous stasis. Open mild abrasion on left anterior tibia. No noticeable excess of erythema, warmth or swelling in noted painful area along medial border of tibia       Assessment & Plan:  Cellulitis Continue proper skin hygiene and avoid scratching psoriasis to prevent another infection. Area does not appear to be infectious at this time.  Psoriasis Continue tryamcinolone cream PRN, refilled prescription at today's visit.   Thrombocytopenia Unchanged since last visit, continue to monitor for decrease q3-6 months due to PMH.  Cirrhosis State unchanged at this time. Continue to follow with a specialist, and monitor with LFTs  Obesity Encouraged addition of exercise to her current portion control. Advised to contact if she requests additional help  Leandra Kern PA-S

## 2012-12-03 NOTE — Patient Instructions (Signed)
Wt Readings from Last 3 Encounters:  12/03/12 271 lb (122.925 kg)  10/16/12 271 lb (122.925 kg)  09/11/12 272 lb (123.378 kg)

## 2012-12-05 ENCOUNTER — Other Ambulatory Visit: Payer: Self-pay | Admitting: *Deleted

## 2012-12-05 MED ORDER — TRIAMCINOLONE ACETONIDE 0.5 % EX OINT: TOPICAL_OINTMENT | Freq: Two times a day (BID) | CUTANEOUS | Status: DC

## 2012-12-16 ENCOUNTER — Other Ambulatory Visit: Payer: Self-pay | Admitting: Internal Medicine

## 2012-12-16 ENCOUNTER — Encounter: Payer: Self-pay | Admitting: Internal Medicine

## 2012-12-16 NOTE — Progress Notes (Signed)
  Subjective:    Patient ID: Lori Mills, female    DOB: June 15, 1950, 62 y.o.   MRN: [id number]  HPI  PA student's note reviewed. I personally examined the pt  Review of Systems     Objective:   Physical Exam        Assessment & Plan:

## 2013-01-29 ENCOUNTER — Other Ambulatory Visit: Payer: Self-pay | Admitting: Internal Medicine

## 2013-02-19 ENCOUNTER — Ambulatory Visit: Payer: BC Managed Care – PPO | Admitting: Internal Medicine

## 2013-02-19 DIAGNOSIS — Z0289 Encounter for other administrative examinations: Secondary | ICD-10-CM

## 2013-03-05 ENCOUNTER — Ambulatory Visit (INDEPENDENT_AMBULATORY_CARE_PROVIDER_SITE_OTHER): Payer: BC Managed Care – PPO | Admitting: Internal Medicine

## 2013-03-05 ENCOUNTER — Encounter: Payer: Self-pay | Admitting: Internal Medicine

## 2013-03-05 ENCOUNTER — Other Ambulatory Visit (INDEPENDENT_AMBULATORY_CARE_PROVIDER_SITE_OTHER): Payer: BC Managed Care – PPO

## 2013-03-05 VITALS — BP 136/70 | HR 96 | Temp 97.7°F | Resp 16 | Wt 274.0 lb

## 2013-03-05 DIAGNOSIS — L409 Psoriasis, unspecified: Secondary | ICD-10-CM

## 2013-03-05 DIAGNOSIS — L408 Other psoriasis: Secondary | ICD-10-CM

## 2013-03-05 DIAGNOSIS — D6959 Other secondary thrombocytopenia: Secondary | ICD-10-CM

## 2013-03-05 DIAGNOSIS — Z23 Encounter for immunization: Secondary | ICD-10-CM

## 2013-03-05 LAB — CBC WITH DIFFERENTIAL/PLATELET
Eosinophils Absolute: 0.1 10*3/uL (ref 0.0–0.7)
Eosinophils Relative: 3.8 % (ref 0.0–5.0)
HCT: 35.4 % — ABNORMAL LOW (ref 36.0–46.0)
Lymphs Abs: 0.7 10*3/uL (ref 0.7–4.0)
MCHC: 34.2 g/dL (ref 30.0–36.0)
MCV: 90.4 fl (ref 78.0–100.0)
Monocytes Absolute: 0.2 10*3/uL (ref 0.1–1.0)
Monocytes Relative: 8.4 % (ref 3.0–12.0)
Platelets: 74 10*3/uL — ABNORMAL LOW (ref 150.0–400.0)
RDW: 15.3 % — ABNORMAL HIGH (ref 11.5–14.6)
WBC: 2.9 10*3/uL — ABNORMAL LOW (ref 4.5–10.5)

## 2013-03-05 MED ORDER — LORCASERIN HCL 10 MG PO TABS: 1.0000 | ORAL_TABLET | Freq: Two times a day (BID) | ORAL | Status: DC

## 2013-03-05 MED ORDER — HYDROCODONE-ACETAMINOPHEN 7.5-325 MG PO TABS: ORAL_TABLET | ORAL | Status: DC

## 2013-03-05 NOTE — Progress Notes (Signed)
    Subjective:     HPI   The patient is here to follow up on chronic HTN, hypothyroidism, psoriasis, depression, anxiety, LBP  and chronic moderate fibromyalgia symptoms partially controlled with medicines. Her PLTs were at 74K w/her Derm   BP Readings from Last 3 Encounters:  03/05/13 136/70  12/03/12 140/62  10/16/12 144/60   Wt Readings from Last 3 Encounters:  03/05/13 274 lb (124.286 kg)  12/03/12 271 lb (122.925 kg)  10/16/12 271 lb (122.925 kg)       Review of Systems  Constitutional: Positive for fatigue. Negative for chills, activity change, appetite change and unexpected weight change.  HENT: Negative for congestion, mouth sores and sinus pressure.   Eyes: Negative for visual disturbance.  Respiratory: Negative for cough and chest tightness.   Genitourinary: Negative for difficulty urinating and vaginal pain.  Musculoskeletal: Positive for back pain. Negative for gait problem.  Skin: Positive for rash. Negative for pallor.  Neurological: Negative for dizziness, tremors and weakness.  Psychiatric/Behavioral: Positive for sleep disturbance. Negative for suicidal ideas and confusion. The patient is nervous/anxious.        Objective:   Physical Exam  Constitutional: She appears well-developed. No distress.  Obese  HENT:  Head: Normocephalic.  Right Ear: External ear normal.  Left Ear: External ear normal.  Nose: Nose normal.  Mouth/Throat: Oropharynx is clear and moist.  Eyes: Conjunctivae are normal. Pupils are equal, round, and reactive to light. Right eye exhibits no discharge. Left eye exhibits no discharge.  Neck: Normal range of motion. Neck supple. No JVD present. No tracheal deviation present. No thyromegaly present.  Cardiovascular: Normal rate, regular rhythm and normal heart sounds.   Pulmonary/Chest: No stridor. No respiratory distress. She has no wheezes. She exhibits no tenderness.  Abdominal: Soft. Bowel sounds are normal. She exhibits no  distension and no mass. There is no tenderness. There is no rebound and no guarding.  Musculoskeletal: She exhibits no edema and no tenderness.  Lymphadenopathy:    She has no cervical adenopathy.  Neurological: She displays normal reflexes. No cranial nerve deficit. She exhibits normal muscle tone. Coordination normal.  Skin: Rash (extensive rashes) noted. No erythema.  Hyperpigmented sollen distal shins L inner dist shin w/erythema, tener  Psychiatric: Her behavior is normal. Judgment and thought content normal.     Lab Results  Component Value Date   WBC 2.9* 12/02/2012   HGB 12.4 12/02/2012   HCT 36.1 12/02/2012   PLT 68.0* 12/02/2012   GLUCOSE 102* 12/02/2012   CHOL 164 06/02/2010   TRIG 97.0 06/02/2010   HDL 43.50 06/02/2010   LDLCALC 101* 06/02/2010   ALT 21 08/13/2012   AST 30 08/13/2012   NA 140 12/02/2012   K 3.8 12/02/2012   CL 109 12/02/2012   CREATININE 0.7 12/02/2012   BUN 9 12/02/2012   CO2 30 12/02/2012   TSH 1.54 09/11/2012   INR 1.24 11/24/2009   HGBA1C 4.8 12/02/2012        Assessment & Plan:

## 2013-03-05 NOTE — Assessment & Plan Note (Signed)
Continue with current prescription therapy as reflected on the Med list.  

## 2013-03-05 NOTE — Assessment & Plan Note (Signed)
CBC

## 2013-03-05 NOTE — Assessment & Plan Note (Signed)
Worse Will try Belviq w/caution. Monitor labs

## 2013-03-06 LAB — BASIC METABOLIC PANEL
CO2: 23 mEq/L (ref 19–32)
Calcium: 8.8 mg/dL (ref 8.4–10.5)
Creatinine, Ser: 0.8 mg/dL (ref 0.4–1.2)
GFR: 81.51 mL/min (ref >60.00)
Glucose, Bld: 84 mg/dL (ref 70–99)
Sodium: 143 mEq/L (ref 135–145)

## 2013-03-06 LAB — HEPATIC FUNCTION PANEL
Albumin: 3.2 g/dL — ABNORMAL LOW (ref 3.5–5.2)
Alkaline Phosphatase: 114 U/L (ref 39–117)

## 2013-03-07 ENCOUNTER — Telehealth: Payer: Self-pay | Admitting: *Deleted

## 2013-03-07 NOTE — Telephone Encounter (Signed)
Pt wants the Sept No-Show removed.  She thought she was told to come on a Thursday.  When she came in on Thurs., she was told the appt was on Tues.

## 2013-03-07 NOTE — Telephone Encounter (Signed)
Pt called states Belviq needs PA.  Further states PA phone number is [phone]  Please advise

## 2013-03-10 NOTE — Telephone Encounter (Signed)
Pt called to check up on the PA request for belviq. Please call pt back once its done, pt stated if we call that 1800 number, it would be faster than faxing it.

## 2013-03-12 NOTE — Telephone Encounter (Signed)
Pt made aware that pa was approved for Belviq  # [id number]

## 2013-04-07 ENCOUNTER — Ambulatory Visit: Payer: BC Managed Care – PPO | Admitting: Internal Medicine

## 2013-05-06 ENCOUNTER — Ambulatory Visit (INDEPENDENT_AMBULATORY_CARE_PROVIDER_SITE_OTHER): Payer: BC Managed Care – PPO | Admitting: Internal Medicine

## 2013-05-06 ENCOUNTER — Encounter: Payer: Self-pay | Admitting: Internal Medicine

## 2013-05-06 VITALS — BP 140/76 | HR 80 | Temp 98.2°F | Resp 16 | Wt 270.0 lb

## 2013-05-06 DIAGNOSIS — I10 Essential (primary) hypertension: Secondary | ICD-10-CM

## 2013-05-06 DIAGNOSIS — J019 Acute sinusitis, unspecified: Secondary | ICD-10-CM

## 2013-05-06 DIAGNOSIS — M545 Low back pain: Secondary | ICD-10-CM

## 2013-05-06 DIAGNOSIS — J209 Acute bronchitis, unspecified: Secondary | ICD-10-CM

## 2013-05-06 DIAGNOSIS — K7460 Unspecified cirrhosis of liver: Secondary | ICD-10-CM

## 2013-05-06 MED ORDER — HYDROCODONE-ACETAMINOPHEN 7.5-325 MG PO TABS: ORAL_TABLET | ORAL | Status: DC

## 2013-05-06 MED ORDER — PROMETHAZINE-CODEINE 6.25-10 MG/5ML PO SYRP
5.0000 mL | ORAL_SOLUTION | ORAL | Status: DC | PRN
Start: 2013-05-06 — End: 2013-06-11

## 2013-05-06 MED ORDER — AZITHROMYCIN 250 MG PO TABS: ORAL_TABLET | ORAL | Status: DC

## 2013-05-06 NOTE — Assessment & Plan Note (Signed)
Continue with current prn prescription therapy as reflected on the Med list.  

## 2013-05-06 NOTE — Assessment & Plan Note (Signed)
See Rx 

## 2013-05-06 NOTE — Assessment & Plan Note (Signed)
Wt Readings from Last 3 Encounters:  05/06/13 270 lb (122.471 kg)  03/05/13 274 lb (124.286 kg)  12/03/12 271 lb (122.925 kg)    

## 2013-05-06 NOTE — Assessment & Plan Note (Signed)
See meds 

## 2013-05-06 NOTE — Progress Notes (Signed)
Pre visit review using our clinic review tool, if applicable. No additional management support is needed unless otherwise documented below in the visit note. 

## 2013-05-06 NOTE — Patient Instructions (Signed)
Wt Readings from Last 3 Encounters:  05/06/13 270 lb (122.471 kg)  03/05/13 274 lb (124.286 kg)  12/03/12 271 lb (122.925 kg)

## 2013-05-06 NOTE — Assessment & Plan Note (Signed)
Monitoring labs 

## 2013-05-06 NOTE — Assessment & Plan Note (Signed)
Continue with current prescription therapy as reflected on the Med list.  

## 2013-05-06 NOTE — Progress Notes (Signed)
    Subjective:     Cough This is a new problem. The current episode started in the past 7 days. The problem has been gradually worsening. The problem occurs every few minutes. The cough is productive of brown sputum. Associated symptoms include a rash. Pertinent negatives include no chills.  Sinusitis This is a new problem. The current episode started in the past 7 days. Associated symptoms include coughing. Pertinent negatives include no chills, congestion or sinus pressure.     The patient is here to follow up on chronic psoriasis, depression, anxiety, LBP  and chronic moderate fibromyalgia symptoms partially controlled with medicines.    BP Readings from Last 3 Encounters:  05/06/13 140/76  03/05/13 136/70  12/03/12 140/62   Wt Readings from Last 3 Encounters:  05/06/13 270 lb (122.471 kg)  03/05/13 274 lb (124.286 kg)  12/03/12 271 lb (122.925 kg)       Review of Systems  Constitutional: Positive for fatigue. Negative for chills, activity change, appetite change and unexpected weight change.  HENT: Negative for congestion, mouth sores and sinus pressure.   Eyes: Negative for visual disturbance.  Respiratory: Positive for cough. Negative for chest tightness.   Genitourinary: Negative for difficulty urinating and vaginal pain.  Musculoskeletal: Positive for back pain. Negative for gait problem.  Skin: Positive for rash. Negative for pallor.  Neurological: Negative for dizziness, tremors and weakness.  Psychiatric/Behavioral: Positive for sleep disturbance. Negative for suicidal ideas and confusion. The patient is nervous/anxious.        Objective:   Physical Exam  Constitutional: She appears well-developed. No distress.  Obese  HENT:  Head: Normocephalic.  Right Ear: External ear normal.  Left Ear: External ear normal.  Nose: Nose normal.  Mouth/Throat: Oropharynx is clear and moist.  Eyes: Conjunctivae are normal. Pupils are equal, round, and reactive to light.  Right eye exhibits no discharge. Left eye exhibits no discharge.  Neck: Normal range of motion. Neck supple. No JVD present. No tracheal deviation present. No thyromegaly present.  Cardiovascular: Normal rate, regular rhythm and normal heart sounds.   Pulmonary/Chest: No stridor. No respiratory distress. She has no wheezes. She exhibits no tenderness.  Abdominal: Soft. Bowel sounds are normal. She exhibits no distension and no mass. There is no tenderness. There is no rebound and no guarding.  Musculoskeletal: She exhibits no edema and no tenderness.  Lymphadenopathy:    She has no cervical adenopathy.  Neurological: She displays normal reflexes. No cranial nerve deficit. She exhibits normal muscle tone. Coordination normal.  Skin: Rash (extensive rashes) noted. No erythema.  Hyperpigmented sollen distal shins L inner dist shin w/erythema, tener  Psychiatric: Her behavior is normal. Judgment and thought content normal.  eryth throat Coughing a lot   Lab Results  Component Value Date   WBC 2.9* 03/05/2013   HGB 12.1 03/05/2013   HCT 35.4* 03/05/2013   PLT 74.0* 03/05/2013   GLUCOSE 84 03/05/2013   CHOL 164 06/02/2010   TRIG 97.0 06/02/2010   HDL 43.50 06/02/2010   LDLCALC 101* 06/02/2010   ALT 18 03/05/2013   AST 40* 03/05/2013   NA 143 03/05/2013   K 3.7 03/05/2013   CL 111 03/05/2013   CREATININE 0.8 03/05/2013   BUN 8 03/05/2013   CO2 23 03/05/2013   TSH 1.54 09/11/2012   INR 1.24 11/24/2009   HGBA1C 4.8 12/02/2012        Assessment & Plan:

## 2013-06-11 ENCOUNTER — Encounter: Payer: Self-pay | Admitting: Internal Medicine

## 2013-06-11 ENCOUNTER — Ambulatory Visit (INDEPENDENT_AMBULATORY_CARE_PROVIDER_SITE_OTHER)
Admission: RE | Admit: 2013-06-11 | Discharge: 2013-06-11 | Disposition: A | Discharge status: Home / Self Care | Payer: BC Managed Care – PPO | Source: Ambulatory Visit | Attending: Internal Medicine | Admitting: Internal Medicine

## 2013-06-11 ENCOUNTER — Ambulatory Visit (INDEPENDENT_AMBULATORY_CARE_PROVIDER_SITE_OTHER): Payer: BC Managed Care – PPO | Admitting: Internal Medicine

## 2013-06-11 VITALS — BP 140/82 | HR 80 | Temp 97.3°F | Resp 16 | Wt 259.0 lb

## 2013-06-11 DIAGNOSIS — J189 Pneumonia, unspecified organism: Secondary | ICD-10-CM

## 2013-06-11 DIAGNOSIS — J181 Lobar pneumonia, unspecified organism: Secondary | ICD-10-CM

## 2013-06-11 DIAGNOSIS — D6959 Other secondary thrombocytopenia: Secondary | ICD-10-CM

## 2013-06-11 DIAGNOSIS — J209 Acute bronchitis, unspecified: Secondary | ICD-10-CM

## 2013-06-11 DIAGNOSIS — D731 Hypersplenism: Secondary | ICD-10-CM

## 2013-06-11 MED ORDER — MOMETASONE FURO-FORMOTEROL FUM 100-5 MCG/ACT IN AERO: 2.0000 | INHALATION_SPRAY | Freq: Two times a day (BID) | RESPIRATORY_TRACT | Status: DC

## 2013-06-11 MED ORDER — HYDROCODONE-ACETAMINOPHEN 7.5-325 MG PO TABS: ORAL_TABLET | ORAL | Status: DC

## 2013-06-11 MED ORDER — LEVOFLOXACIN 500 MG PO TABS: 500.0000 mg | ORAL_TABLET | Freq: Every day | ORAL | Status: DC

## 2013-06-11 MED ORDER — PROMETHAZINE-CODEINE 6.25-10 MG/5ML PO SYRP: 5.0000 mL | ORAL_SOLUTION | ORAL | Status: DC | PRN

## 2013-06-11 MED ORDER — METHYLPREDNISOLONE ACETATE 80 MG/ML IJ SUSP
80.0000 mg | Freq: Once | INTRAMUSCULAR | Status: AC
  Administered 2013-06-11: 80 mg via INTRAMUSCULAR

## 2013-06-11 NOTE — Assessment & Plan Note (Signed)
Wt Readings from Last 3 Encounters:  06/11/13 259 lb (117.482 kg)  05/06/13 270 lb (122.471 kg)  03/05/13 274 lb (124.286 kg)  better

## 2013-06-11 NOTE — Assessment & Plan Note (Addendum)
Prom-cod syr Levaquin x 10 d Dulera 2 inh bid Depomedrol for bronchospasm

## 2013-06-11 NOTE — Progress Notes (Signed)
Pre visit review using our clinic review tool, if applicable. No additional management support is needed unless otherwise documented below in the visit note. 

## 2013-06-11 NOTE — Progress Notes (Signed)
Subjective:     Cough This is a new problem. The current episode started 1 to 4 weeks ago. The problem has been gradually worsening. The problem occurs every few minutes. The cough is productive of brown sputum. Associated symptoms include a rash. Pertinent negatives include no chills.  Sinusitis This is a new problem. The current episode started in the past 7 days. Associated symptoms include coughing. Pertinent negatives include no chills, congestion or sinus pressure.     The patient is here to follow up on chronic psoriasis, depression, anxiety, LBP  and chronic moderate fibromyalgia symptoms partially controlled with medicines.    BP Readings from Last 3 Encounters:  06/11/13 140/82  05/06/13 140/76  03/05/13 136/70   Wt Readings from Last 3 Encounters:  06/11/13 259 lb (117.482 kg)  05/06/13 270 lb (122.471 kg)  03/05/13 274 lb (124.286 kg)       Review of Systems  Constitutional: Positive for fatigue. Negative for chills, activity change, appetite change and unexpected weight change.  HENT: Negative for congestion, mouth sores and sinus pressure.   Eyes: Negative for visual disturbance.  Respiratory: Positive for cough. Negative for chest tightness.   Genitourinary: Negative for difficulty urinating and vaginal pain.  Musculoskeletal: Positive for back pain. Negative for gait problem.  Skin: Positive for rash. Negative for pallor.  Neurological: Negative for dizziness, tremors and weakness.  Psychiatric/Behavioral: Positive for sleep disturbance. Negative for suicidal ideas and confusion. The patient is nervous/anxious.        Objective:   Physical Exam  Constitutional: She appears well-developed. No distress.  Obese  HENT:  Head: Normocephalic.  Right Ear: External ear normal.  Left Ear: External ear normal.  Nose: Nose normal.  Mouth/Throat: Oropharynx is clear and moist.  Eyes: Conjunctivae are normal. Pupils are equal, round, and reactive to light.  Right eye exhibits no discharge. Left eye exhibits no discharge.  Neck: Normal range of motion. Neck supple. No JVD present. No tracheal deviation present. No thyromegaly present.  Cardiovascular: Normal rate, regular rhythm and normal heart sounds.   Pulmonary/Chest: No stridor. No respiratory distress. She has no wheezes. She exhibits no tenderness.  Abdominal: Soft. Bowel sounds are normal. She exhibits no distension and no mass. There is no tenderness. There is no rebound and no guarding.  Musculoskeletal: She exhibits no edema and no tenderness.  Lymphadenopathy:    She has no cervical adenopathy.  Neurological: She displays normal reflexes. No cranial nerve deficit. She exhibits normal muscle tone. Coordination normal.  Skin: Rash (extensive rashes) noted. No erythema.  Hyperpigmented sollen distal shins L inner dist shin w/erythema, tener  Psychiatric: Her behavior is normal. Judgment and thought content normal.  eryth throat Coughing a lot   Lab Results  Component Value Date   WBC 2.9* 03/05/2013   HGB 12.1 03/05/2013   HCT 35.4* 03/05/2013   PLT 74.0* 03/05/2013   GLUCOSE 84 03/05/2013   CHOL 164 06/02/2010   TRIG 97.0 06/02/2010   HDL 43.50 06/02/2010   LDLCALC 101* 06/02/2010   ALT 18 03/05/2013   AST 40* 03/05/2013   NA 143 03/05/2013   K 3.7 03/05/2013   CL 111 03/05/2013   CREATININE 0.8 03/05/2013   BUN 8 03/05/2013   CO2 23 03/05/2013   TSH 1.54 09/11/2012   INR 1.24 11/24/2009   HGBA1C 4.8 12/02/2012   I personally provided Dulera inhaler use teaching. After the teaching patient was able to demonstrate it's use effectively. All questions were answered  Assessment & Plan:

## 2013-06-11 NOTE — Assessment & Plan Note (Signed)
Levaquin x 10 d 

## 2013-06-11 NOTE — Patient Instructions (Addendum)
Wt Readings from Last 3 Encounters:  06/11/13 259 lb (117.482 kg)  05/06/13 270 lb (122.471 kg)  03/05/13 274 lb (124.286 kg)    Use over-the-counter  "cold" medicines  such as  "Afrin" nasal spray for nasal congestion as directed instead. Use" Delsym" or" Robitussin" cough syrup varietis for cough.  You can use plain "Tylenol" or "Advil" for fever, chills and achyness.  Please, make an appointment if you are not better or if you're worse.

## 2013-06-11 NOTE — Assessment & Plan Note (Signed)
Monitoring

## 2013-07-07 ENCOUNTER — Ambulatory Visit (INDEPENDENT_AMBULATORY_CARE_PROVIDER_SITE_OTHER): Payer: BC Managed Care – PPO | Admitting: Internal Medicine

## 2013-07-07 ENCOUNTER — Other Ambulatory Visit (INDEPENDENT_AMBULATORY_CARE_PROVIDER_SITE_OTHER): Payer: BC Managed Care – PPO

## 2013-07-07 ENCOUNTER — Encounter: Payer: Self-pay | Admitting: Internal Medicine

## 2013-07-07 VITALS — BP 120/74 | HR 76 | Temp 98.1°F | Resp 16 | Wt 268.0 lb

## 2013-07-07 DIAGNOSIS — F3289 Other specified depressive episodes: Secondary | ICD-10-CM

## 2013-07-07 DIAGNOSIS — I10 Essential (primary) hypertension: Secondary | ICD-10-CM

## 2013-07-07 DIAGNOSIS — D731 Hypersplenism: Secondary | ICD-10-CM

## 2013-07-07 DIAGNOSIS — D6959 Other secondary thrombocytopenia: Secondary | ICD-10-CM

## 2013-07-07 DIAGNOSIS — F329 Major depressive disorder, single episode, unspecified: Secondary | ICD-10-CM

## 2013-07-07 LAB — CBC WITH DIFFERENTIAL/PLATELET
Basophils Absolute: 0 10*3/uL (ref 0.0–0.1)
Basophils Relative: 0.7 % (ref 0.0–3.0)
EOS ABS: 0.1 10*3/uL (ref 0.0–0.7)
Eosinophils Relative: 3.4 % (ref 0.0–5.0)
HCT: 37.7 % (ref 36.0–46.0)
HEMOGLOBIN: 12.4 g/dL (ref 12.0–15.0)
LYMPHS PCT: 20.1 % (ref 12.0–46.0)
Lymphs Abs: 0.6 10*3/uL — ABNORMAL LOW (ref 0.7–4.0)
MCHC: 32.9 g/dL (ref 30.0–36.0)
MCV: 94.7 fl (ref 78.0–100.0)
MONO ABS: 0.2 10*3/uL (ref 0.1–1.0)
Monocytes Relative: 6.4 % (ref 3.0–12.0)
NEUTROS ABS: 2.2 10*3/uL (ref 1.4–7.7)
Neutrophils Relative %: 69.4 % (ref 43.0–77.0)
Platelets: 60 10*3/uL — ABNORMAL LOW (ref 150.0–400.0)
RBC: 3.98 Mil/uL (ref 3.87–5.11)
RDW: 16.3 % — ABNORMAL HIGH (ref 11.5–14.6)
WBC: 3.2 10*3/uL — AB (ref 4.5–10.5)

## 2013-07-07 LAB — HEPATIC FUNCTION PANEL
ALK PHOS: 178 U/L — AB (ref 39–117)
ALT: 25 U/L (ref 0–35)
AST: 34 U/L (ref 0–37)
Albumin: 3 g/dL — ABNORMAL LOW (ref 3.5–5.2)
Bilirubin, Direct: 0.4 mg/dL — ABNORMAL HIGH (ref 0.0–0.3)
TOTAL PROTEIN: 7 g/dL (ref 6.0–8.3)
Total Bilirubin: 1.8 mg/dL — ABNORMAL HIGH (ref 0.3–1.2)

## 2013-07-07 LAB — BASIC METABOLIC PANEL
BUN: 9 mg/dL (ref 6–23)
CO2: 31 meq/L (ref 19–32)
Calcium: 8.7 mg/dL (ref 8.4–10.5)
Chloride: 105 mEq/L (ref 96–112)
Creatinine, Ser: 0.7 mg/dL (ref 0.4–1.2)
GFR: 94.17 mL/min (ref >60.00)
Glucose, Bld: 92 mg/dL (ref 70–99)
Potassium: 3.8 mEq/L (ref 3.5–5.1)
SODIUM: 139 meq/L (ref 135–145)

## 2013-07-07 LAB — PROTIME-INR
INR: 1.5 ratio — ABNORMAL HIGH (ref 0.8–1.0)
PROTHROMBIN TIME: 15.3 s — AB (ref 10.2–12.4)

## 2013-07-07 MED ORDER — HYDROCODONE-ACETAMINOPHEN 7.5-325 MG PO TABS: ORAL_TABLET | ORAL | Status: DC

## 2013-07-07 NOTE — Assessment & Plan Note (Signed)
Continue with current prescription therapy as reflected on the Med list.  

## 2013-07-07 NOTE — Progress Notes (Signed)
    Subjective:     HPI   The patient is here to follow up on chronic HTN, hypothyroidism, psoriasis, depression, anxiety, LBP  and chronic moderate fibromyalgia symptoms partially controlled with medicines. Her PLTs were at 74K w/her Derm   BP Readings from Last 3 Encounters:  07/07/13 120/74  06/11/13 140/82  05/06/13 140/76   Wt Readings from Last 3 Encounters:  07/07/13 268 lb (121.564 kg)  06/11/13 259 lb (117.482 kg)  05/06/13 270 lb (122.471 kg)       Review of Systems  Constitutional: Positive for fatigue. Negative for chills, activity change, appetite change and unexpected weight change.  HENT: Negative for congestion, mouth sores and sinus pressure.   Eyes: Negative for visual disturbance.  Respiratory: Negative for cough and chest tightness.   Genitourinary: Negative for difficulty urinating and vaginal pain.  Musculoskeletal: Positive for back pain. Negative for gait problem.  Skin: Positive for rash. Negative for pallor.  Neurological: Negative for dizziness, tremors and weakness.  Psychiatric/Behavioral: Positive for sleep disturbance. Negative for suicidal ideas and confusion. The patient is nervous/anxious.        Objective:   Physical Exam  Constitutional: She appears well-developed. No distress.  Obese  HENT:  Head: Normocephalic.  Right Ear: External ear normal.  Left Ear: External ear normal.  Nose: Nose normal.  Mouth/Throat: Oropharynx is clear and moist.  Eyes: Conjunctivae are normal. Pupils are equal, round, and reactive to light. Right eye exhibits no discharge. Left eye exhibits no discharge.  Neck: Normal range of motion. Neck supple. No JVD present. No tracheal deviation present. No thyromegaly present.  Cardiovascular: Normal rate, regular rhythm and normal heart sounds.   Pulmonary/Chest: No stridor. No respiratory distress. She has no wheezes. She exhibits no tenderness.  Abdominal: Soft. Bowel sounds are normal. She exhibits no  distension and no mass. There is no tenderness. There is no rebound and no guarding.  Musculoskeletal: She exhibits no edema and no tenderness.  Lymphadenopathy:    She has no cervical adenopathy.  Neurological: She displays normal reflexes. No cranial nerve deficit. She exhibits normal muscle tone. Coordination normal.  Skin: Rash (extensive rashes) noted. No erythema.  Hyperpigmented sollen distal shins L inner dist shin w/erythema, tener  Psychiatric: Her behavior is normal. Judgment and thought content normal.     Lab Results  Component Value Date   WBC 2.9* 03/05/2013   HGB 12.1 03/05/2013   HCT 35.4* 03/05/2013   PLT 74.0* 03/05/2013   GLUCOSE 84 03/05/2013   CHOL 164 06/02/2010   TRIG 97.0 06/02/2010   HDL 43.50 06/02/2010   LDLCALC 101* 06/02/2010   ALT 18 03/05/2013   AST 40* 03/05/2013   NA 143 03/05/2013   K 3.7 03/05/2013   CL 111 03/05/2013   CREATININE 0.8 03/05/2013   BUN 8 03/05/2013   CO2 23 03/05/2013   TSH 1.54 09/11/2012   INR 1.24 11/24/2009   HGBA1C 4.8 12/02/2012        Assessment & Plan:

## 2013-07-07 NOTE — Patient Instructions (Signed)
Wt Readings from Last 3 Encounters:  07/07/13 268 lb (121.564 kg)  06/11/13 259 lb (117.482 kg)  05/06/13 270 lb (122.471 kg)

## 2013-07-07 NOTE — Progress Notes (Signed)
Pre visit review using our clinic review tool, if applicable. No additional management support is needed unless otherwise documented below in the visit note. 

## 2013-07-07 NOTE — Assessment & Plan Note (Signed)
Labs

## 2013-07-07 NOTE — Assessment & Plan Note (Signed)
She will try Dr Atkins diet 

## 2013-07-10 ENCOUNTER — Encounter: Payer: Self-pay | Admitting: Internal Medicine

## 2013-07-11 ENCOUNTER — Other Ambulatory Visit: Payer: Self-pay | Admitting: Internal Medicine

## 2013-07-11 DIAGNOSIS — K7460 Unspecified cirrhosis of liver: Secondary | ICD-10-CM

## 2013-10-07 ENCOUNTER — Other Ambulatory Visit (INDEPENDENT_AMBULATORY_CARE_PROVIDER_SITE_OTHER): Payer: BC Managed Care – PPO

## 2013-10-07 ENCOUNTER — Encounter: Payer: Self-pay | Admitting: Internal Medicine

## 2013-10-07 ENCOUNTER — Ambulatory Visit (INDEPENDENT_AMBULATORY_CARE_PROVIDER_SITE_OTHER): Payer: BC Managed Care – PPO | Admitting: Internal Medicine

## 2013-10-07 VITALS — BP 140/70 | HR 80 | Temp 98.1°F | Resp 16 | Wt 278.0 lb

## 2013-10-07 DIAGNOSIS — K7460 Unspecified cirrhosis of liver: Secondary | ICD-10-CM

## 2013-10-07 DIAGNOSIS — D6959 Other secondary thrombocytopenia: Secondary | ICD-10-CM

## 2013-10-07 DIAGNOSIS — F419 Anxiety disorder, unspecified: Secondary | ICD-10-CM

## 2013-10-07 DIAGNOSIS — F3289 Other specified depressive episodes: Secondary | ICD-10-CM

## 2013-10-07 DIAGNOSIS — F411 Generalized anxiety disorder: Secondary | ICD-10-CM

## 2013-10-07 DIAGNOSIS — M545 Low back pain, unspecified: Secondary | ICD-10-CM

## 2013-10-07 DIAGNOSIS — F329 Major depressive disorder, single episode, unspecified: Secondary | ICD-10-CM

## 2013-10-07 DIAGNOSIS — I10 Essential (primary) hypertension: Secondary | ICD-10-CM

## 2013-10-07 DIAGNOSIS — D731 Hypersplenism: Secondary | ICD-10-CM

## 2013-10-07 LAB — BASIC METABOLIC PANEL
BUN: 7 mg/dL (ref 6–23)
CO2: 31 mEq/L (ref 19–32)
CREATININE: 0.7 mg/dL (ref 0.4–1.2)
Calcium: 9 mg/dL (ref 8.4–10.5)
Chloride: 107 mEq/L (ref 96–112)
GFR: 92.5 mL/min (ref >60.00)
Glucose, Bld: 83 mg/dL (ref 70–99)
POTASSIUM: 3.8 meq/L (ref 3.5–5.1)
Sodium: 142 mEq/L (ref 135–145)

## 2013-10-07 LAB — CBC WITH DIFFERENTIAL/PLATELET
BASOS ABS: 0 10*3/uL (ref 0.0–0.1)
Basophils Relative: 0.3 % (ref 0.0–3.0)
EOS ABS: 0.1 10*3/uL (ref 0.0–0.7)
Eosinophils Relative: 3.6 % (ref 0.0–5.0)
HCT: 36.9 % (ref 36.0–46.0)
Hemoglobin: 12.6 g/dL (ref 12.0–15.0)
LYMPHS ABS: 0.7 10*3/uL (ref 0.7–4.0)
Lymphocytes Relative: 20.3 % (ref 12.0–46.0)
MCHC: 34 g/dL (ref 30.0–36.0)
MCV: 93.2 fl (ref 78.0–100.0)
MONO ABS: 0.2 10*3/uL (ref 0.1–1.0)
MONOS PCT: 5.9 % (ref 3.0–12.0)
Neutro Abs: 2.3 10*3/uL (ref 1.4–7.7)
Neutrophils Relative %: 69.9 % (ref 43.0–77.0)
Platelets: 69 10*3/uL — ABNORMAL LOW (ref 150.0–400.0)
RBC: 3.96 Mil/uL (ref 3.87–5.11)
RDW: 14.7 % (ref 11.5–15.5)
WBC: 3.2 10*3/uL — ABNORMAL LOW (ref 4.0–10.5)

## 2013-10-07 LAB — PROTIME-INR
INR: 1.4 ratio — ABNORMAL HIGH (ref 0.8–1.0)
Prothrombin Time: 15.9 s — ABNORMAL HIGH (ref 9.6–13.1)

## 2013-10-07 LAB — HEPATIC FUNCTION PANEL
ALBUMIN: 3.1 g/dL — AB (ref 3.5–5.2)
ALT: 19 U/L (ref 0–35)
AST: 34 U/L (ref 0–37)
Alkaline Phosphatase: 114 U/L (ref 39–117)
Bilirubin, Direct: 0.7 mg/dL — ABNORMAL HIGH (ref 0.0–0.3)
Total Bilirubin: 2.7 mg/dL — ABNORMAL HIGH (ref 0.2–1.2)
Total Protein: 7.1 g/dL (ref 6.0–8.3)

## 2013-10-07 MED ORDER — LEVOTHYROXINE SODIUM 50 MCG PO TABS: 50.0000 ug | ORAL_TABLET | Freq: Every day | ORAL | Status: DC

## 2013-10-07 MED ORDER — TRIAMCINOLONE ACETONIDE 0.5 % EX OINT: TOPICAL_OINTMENT | Freq: Two times a day (BID) | CUTANEOUS | Status: DC

## 2013-10-07 MED ORDER — LOSARTAN POTASSIUM 100 MG PO TABS: ORAL_TABLET | ORAL | Status: DC

## 2013-10-07 MED ORDER — HYDROCODONE-ACETAMINOPHEN 7.5-325 MG PO TABS: ORAL_TABLET | ORAL | Status: DC

## 2013-10-07 MED ORDER — GABAPENTIN 100 MG PO CAPS: ORAL_CAPSULE | ORAL | Status: DC

## 2013-10-07 NOTE — Assessment & Plan Note (Signed)
Labs

## 2013-10-07 NOTE — Assessment & Plan Note (Addendum)
Labs I suggested we switch to oxycodone for pain

## 2013-10-07 NOTE — Patient Instructions (Addendum)
Plan Z diet (Zola) diet

## 2013-10-07 NOTE — Assessment & Plan Note (Signed)
Continue with current prescription therapy as reflected on the Med list.  

## 2013-10-07 NOTE — Assessment & Plan Note (Signed)
I suggested we switch to oxycodone for pain

## 2013-10-07 NOTE — Progress Notes (Signed)
Pre visit review using our clinic review tool, if applicable. No additional management support is needed unless otherwise documented below in the visit note. 

## 2013-10-07 NOTE — Progress Notes (Signed)
Subjective:     HPI  C/o stress w/sick husband; put wt on  The patient is here to follow up on chronic HTN, hypothyroidism, psoriasis, depression, anxiety, LBP  and chronic moderate fibromyalgia symptoms partially controlled with medicines. Her PLTs were at 74K w/her Derm   BP Readings from Last 3 Encounters:  10/07/13 140/70  07/07/13 120/74  06/11/13 140/82   Wt Readings from Last 3 Encounters:  10/07/13 278 lb (126.1 kg)  07/07/13 268 lb (121.564 kg)  06/11/13 259 lb (117.482 kg)       Review of Systems  Constitutional: Positive for fatigue. Negative for chills, activity change, appetite change and unexpected weight change.  HENT: Negative for congestion, mouth sores and sinus pressure.   Eyes: Negative for visual disturbance.  Respiratory: Negative for cough and chest tightness.   Genitourinary: Negative for difficulty urinating and vaginal pain.  Musculoskeletal: Positive for back pain. Negative for gait problem.  Skin: Positive for rash. Negative for pallor.  Neurological: Negative for dizziness, tremors and weakness.  Psychiatric/Behavioral: Positive for sleep disturbance. Negative for suicidal ideas and confusion. The patient is nervous/anxious.        Objective:   Physical Exam  Constitutional: She appears well-developed. No distress.  Obese  HENT:  Head: Normocephalic.  Right Ear: External ear normal.  Left Ear: External ear normal.  Nose: Nose normal.  Mouth/Throat: Oropharynx is clear and moist.  Eyes: Conjunctivae are normal. Pupils are equal, round, and reactive to light. Right eye exhibits no discharge. Left eye exhibits no discharge.  Neck: Normal range of motion. Neck supple. No JVD present. No tracheal deviation present. No thyromegaly present.  Cardiovascular: Normal rate, regular rhythm and normal heart sounds.   Pulmonary/Chest: No stridor. No respiratory distress. She has no wheezes. She exhibits no tenderness.  Abdominal: Soft. Bowel  sounds are normal. She exhibits no distension and no mass. There is no tenderness. There is no rebound and no guarding.  Musculoskeletal: She exhibits no edema and no tenderness.  Lymphadenopathy:    She has no cervical adenopathy.  Neurological: She displays normal reflexes. No cranial nerve deficit. She exhibits normal muscle tone. Coordination normal.  Skin: Rash (extensive rashes) noted. No erythema.  Hyperpigmented sollen distal shins L inner dist shin w/erythema, tener  Psychiatric: Her behavior is normal. Judgment and thought content normal.       Subjective:     HPI   The patient is here to follow up on chronic HTN, hypothyroidism, psoriasis, depression, anxiety, LBP  and chronic moderate fibromyalgia symptoms partially controlled with medicines. Her PLTs were at 74K w/her Derm   BP Readings from Last 3 Encounters:  10/07/13 140/70  07/07/13 120/74  06/11/13 140/82   Wt Readings from Last 3 Encounters:  10/07/13 278 lb (126.1 kg)  07/07/13 268 lb (121.564 kg)  06/11/13 259 lb (117.482 kg)       Review of Systems  Constitutional: Positive for fatigue. Negative for chills, activity change, appetite change and unexpected weight change.  HENT: Negative for congestion, mouth sores and sinus pressure.   Eyes: Negative for visual disturbance.  Respiratory: Negative for cough and chest tightness.   Genitourinary: Negative for difficulty urinating and vaginal pain.  Musculoskeletal: Positive for back pain. Negative for gait problem.  Skin: Positive for rash. Negative for pallor.  Neurological: Negative for dizziness, tremors and weakness.  Psychiatric/Behavioral: Positive for sleep disturbance. Negative for suicidal ideas and confusion. The patient is nervous/anxious.  Objective:   Physical Exam  Constitutional: She appears well-developed. No distress.  Obese  HENT:  Head: Normocephalic.  Right Ear: External ear normal.  Left Ear: External ear normal.   Nose: Nose normal.  Mouth/Throat: Oropharynx is clear and moist.  Eyes: Conjunctivae are normal. Pupils are equal, round, and reactive to light. Right eye exhibits no discharge. Left eye exhibits no discharge.  Neck: Normal range of motion. Neck supple. No JVD present. No tracheal deviation present. No thyromegaly present.  Cardiovascular: Normal rate, regular rhythm and normal heart sounds.   Pulmonary/Chest: No stridor. No respiratory distress. She has no wheezes. She exhibits no tenderness.  Abdominal: Soft. Bowel sounds are normal. She exhibits no distension and no mass. There is no tenderness. There is no rebound and no guarding.  Musculoskeletal: She exhibits no edema and no tenderness.  Lymphadenopathy:    She has no cervical adenopathy.  Neurological: She displays normal reflexes. No cranial nerve deficit. She exhibits normal muscle tone. Coordination normal.  Skin: Rash (extensive rashes) noted. No erythema.  Hyperpigmented sollen distal shins L inner dist shin w/erythema, tener  Psychiatric: Her behavior is normal. Judgment and thought content normal.     Lab Results  Component Value Date   WBC 3.2* 07/07/2013   HGB 12.4 07/07/2013   HCT 37.7 07/07/2013   PLT 60.0 Repeated and verified X2.* 07/07/2013   GLUCOSE 92 07/07/2013   CHOL 164 06/02/2010   TRIG 97.0 06/02/2010   HDL 43.50 06/02/2010   LDLCALC 101* 06/02/2010   ALT 25 07/07/2013   AST 34 07/07/2013   NA 139 07/07/2013   K 3.8 07/07/2013   CL 105 07/07/2013   CREATININE 0.7 07/07/2013   BUN 9 07/07/2013   CO2 31 07/07/2013   TSH 1.54 09/11/2012   INR 1.5* 07/07/2013   HGBA1C 4.8 12/02/2012        Assessment & Plan:   Lab Results  Component Value Date   WBC 3.2* 07/07/2013   HGB 12.4 07/07/2013   HCT 37.7 07/07/2013   PLT 60.0 Repeated and verified X2.* 07/07/2013   GLUCOSE 92 07/07/2013   CHOL 164 06/02/2010   TRIG 97.0 06/02/2010   HDL 43.50 06/02/2010   LDLCALC 101* 06/02/2010   ALT 25 07/07/2013   AST 34 07/07/2013   NA 139  07/07/2013   K 3.8 07/07/2013   CL 105 07/07/2013   CREATININE 0.7 07/07/2013   BUN 9 07/07/2013   CO2 31 07/07/2013   TSH 1.54 09/11/2012   INR 1.5* 07/07/2013   HGBA1C 4.8 12/02/2012        Assessment & Plan:

## 2013-10-07 NOTE — Assessment & Plan Note (Signed)
Plan Z diet Darrick Penna)

## 2013-10-08 ENCOUNTER — Telehealth: Payer: Self-pay | Admitting: Internal Medicine

## 2013-10-08 ENCOUNTER — Other Ambulatory Visit: Payer: Self-pay | Admitting: *Deleted

## 2013-10-08 MED ORDER — TRIAMCINOLONE ACETONIDE 0.1 % EX CREA: TOPICAL_CREAM | Freq: Two times a day (BID) | CUTANEOUS | Status: AC

## 2013-10-08 NOTE — Telephone Encounter (Signed)
Relevant patient education assigned to patient using Emmi. ° °

## 2013-11-12 ENCOUNTER — Other Ambulatory Visit: Payer: Self-pay | Admitting: Internal Medicine

## 2013-12-10 ENCOUNTER — Encounter: Payer: Self-pay | Admitting: Internal Medicine

## 2013-12-10 ENCOUNTER — Other Ambulatory Visit (INDEPENDENT_AMBULATORY_CARE_PROVIDER_SITE_OTHER): Payer: BC Managed Care – PPO

## 2013-12-10 ENCOUNTER — Ambulatory Visit (INDEPENDENT_AMBULATORY_CARE_PROVIDER_SITE_OTHER): Payer: BC Managed Care – PPO | Admitting: Internal Medicine

## 2013-12-10 VITALS — BP 130/70 | HR 64 | Temp 98.2°F | Resp 16 | Wt 274.0 lb

## 2013-12-10 DIAGNOSIS — L409 Psoriasis, unspecified: Secondary | ICD-10-CM

## 2013-12-10 DIAGNOSIS — F411 Generalized anxiety disorder: Secondary | ICD-10-CM

## 2013-12-10 DIAGNOSIS — L408 Other psoriasis: Secondary | ICD-10-CM

## 2013-12-10 DIAGNOSIS — F4321 Adjustment disorder with depressed mood: Secondary | ICD-10-CM

## 2013-12-10 DIAGNOSIS — K7460 Unspecified cirrhosis of liver: Secondary | ICD-10-CM

## 2013-12-10 DIAGNOSIS — D6959 Other secondary thrombocytopenia: Secondary | ICD-10-CM

## 2013-12-10 DIAGNOSIS — F419 Anxiety disorder, unspecified: Secondary | ICD-10-CM

## 2013-12-10 LAB — CBC WITH DIFFERENTIAL/PLATELET
BASOS ABS: 0 10*3/uL (ref 0.0–0.1)
Basophils Relative: 0.4 % (ref 0.0–3.0)
EOS ABS: 0.2 10*3/uL (ref 0.0–0.7)
Eosinophils Relative: 5.6 % — ABNORMAL HIGH (ref 0.0–5.0)
HEMATOCRIT: 35.7 % — AB (ref 36.0–46.0)
HEMOGLOBIN: 12.2 g/dL (ref 12.0–15.0)
Lymphocytes Relative: 21.4 % (ref 12.0–46.0)
Lymphs Abs: 0.6 10*3/uL — ABNORMAL LOW (ref 0.7–4.0)
MCHC: 34.1 g/dL (ref 30.0–36.0)
MCV: 92.9 fl (ref 78.0–100.0)
MONO ABS: 0.2 10*3/uL (ref 0.1–1.0)
Monocytes Relative: 7.7 % (ref 3.0–12.0)
NEUTROS ABS: 1.8 10*3/uL (ref 1.4–7.7)
Neutrophils Relative %: 64.9 % (ref 43.0–77.0)
PLATELETS: 64 10*3/uL — AB (ref 150.0–400.0)
RBC: 3.84 Mil/uL — ABNORMAL LOW (ref 3.87–5.11)
RDW: 15.6 % — AB (ref 11.5–15.5)
WBC: 2.8 10*3/uL — ABNORMAL LOW (ref 4.0–10.5)

## 2013-12-10 LAB — BASIC METABOLIC PANEL
BUN: 7 mg/dL (ref 6–23)
CALCIUM: 9.3 mg/dL (ref 8.4–10.5)
CO2: 31 mEq/L (ref 19–32)
CREATININE: 0.8 mg/dL (ref 0.4–1.2)
Chloride: 105 mEq/L (ref 96–112)
GFR: 82.56 mL/min (ref >60.00)
Glucose, Bld: 100 mg/dL — ABNORMAL HIGH (ref 70–99)
Potassium: 4 mEq/L (ref 3.5–5.1)
SODIUM: 140 meq/L (ref 135–145)

## 2013-12-10 LAB — HEMOGLOBIN A1C: Hgb A1c MFr Bld: 4.6 % (ref 4.6–6.5)

## 2013-12-10 MED ORDER — HYDROCODONE-ACETAMINOPHEN 7.5-325 MG PO TABS: ORAL_TABLET | ORAL | Status: DC

## 2013-12-10 NOTE — Progress Notes (Deleted)
Pre visit review using our clinic review tool, if applicable. No additional management support is needed unless otherwise documented below in the visit note. 

## 2013-12-10 NOTE — Assessment & Plan Note (Signed)
Labs

## 2013-12-10 NOTE — Assessment & Plan Note (Signed)
Monitoring

## 2013-12-10 NOTE — Assessment & Plan Note (Signed)
Continue with current prescription therapy as reflected on the Med list.  

## 2013-12-10 NOTE — Progress Notes (Signed)
   Subjective:    Patient ID: Lori Mills, female    DOB: Jun 21, 1950, 62 y.o.   MRN: [id number]  HPI   C/o more stress w/sick husband - aneurism; put wt on. Her dtr lost her 3d baby...  The patient is here to follow up on chronic HTN, hypothyroidism, psoriasis, depression, anxiety, LBP  and chronic moderate fibromyalgia symptoms partially controlled with medicines. Her PLTs were at 74K w/her Derm  Wt Readings from Last 3 Encounters:  12/10/13 274 lb (124.286 kg)  10/07/13 278 lb (126.1 kg)  07/07/13 268 lb (121.564 kg)   BP Readings from Last 3 Encounters:  12/10/13 130/70  10/07/13 140/70  07/07/13 120/74      Review of Systems  Constitutional: Positive for unexpected weight change. Negative for chills, activity change, appetite change and fatigue.  HENT: Negative for congestion, mouth sores and sinus pressure.   Eyes: Negative for visual disturbance.  Respiratory: Negative for cough and chest tightness.   Gastrointestinal: Negative for nausea and abdominal pain.  Genitourinary: Negative for frequency, difficulty urinating and vaginal pain.  Musculoskeletal: Negative for back pain and gait problem.  Skin: Positive for rash. Negative for pallor.  Neurological: Negative for dizziness, tremors, weakness, numbness and headaches.  Psychiatric/Behavioral: Positive for sleep disturbance. Negative for suicidal ideas and confusion. The patient is nervous/anxious.        Objective:   Physical Exam  Constitutional: She appears well-developed. No distress.  Obese  HENT:  Head: Normocephalic.  Right Ear: External ear normal.  Left Ear: External ear normal.  Nose: Nose normal.  Mouth/Throat: Oropharynx is clear and moist.  Eyes: Conjunctivae are normal. Pupils are equal, round, and reactive to light. Right eye exhibits no discharge. Left eye exhibits no discharge.  Neck: Normal range of motion. Neck supple. No JVD present. No tracheal deviation present. No thyromegaly present.   Cardiovascular: Normal rate, regular rhythm and normal heart sounds.   Pulmonary/Chest: No stridor. No respiratory distress. She has no wheezes.  Abdominal: Soft. Bowel sounds are normal. She exhibits no distension and no mass. There is no tenderness. There is no rebound and no guarding.  Musculoskeletal: She exhibits no edema and no tenderness.  Lymphadenopathy:    She has no cervical adenopathy.  Neurological: She displays normal reflexes. No cranial nerve deficit. She exhibits normal muscle tone. Coordination normal.  Skin: Rash noted. There is erythema.  Extensive psoriasis  Psychiatric: She has a normal mood and affect. Her behavior is normal. Judgment and thought content normal.    Lab Results  Component Value Date   WBC 3.2* 10/07/2013   HGB 12.6 10/07/2013   HCT 36.9 10/07/2013   PLT 69.0* 10/07/2013   GLUCOSE 83 10/07/2013   CHOL 164 06/02/2010   TRIG 97.0 06/02/2010   HDL 43.50 06/02/2010   LDLCALC 101* 06/02/2010   ALT 19 10/07/2013   AST 34 10/07/2013   NA 142 10/07/2013   K 3.8 10/07/2013   CL 107 10/07/2013   CREATININE 0.7 10/07/2013   BUN 7 10/07/2013   CO2 31 10/07/2013   TSH 1.54 09/11/2012   INR 1.4* 10/07/2013   HGBA1C 4.8 12/02/2012         Assessment & Plan:

## 2013-12-10 NOTE — Assessment & Plan Note (Signed)
Wt Readings from Last 3 Encounters:  12/10/13 274 lb (124.286 kg)  10/07/13 278 lb (126.1 kg)  07/07/13 268 lb (121.564 kg)

## 2013-12-10 NOTE — Assessment & Plan Note (Signed)
7/15 her dtr lost her 3d baby - premature labour

## 2014-03-13 ENCOUNTER — Other Ambulatory Visit (INDEPENDENT_AMBULATORY_CARE_PROVIDER_SITE_OTHER): Payer: BC Managed Care – PPO

## 2014-03-13 ENCOUNTER — Ambulatory Visit (INDEPENDENT_AMBULATORY_CARE_PROVIDER_SITE_OTHER): Payer: BC Managed Care – PPO | Admitting: Internal Medicine

## 2014-03-13 ENCOUNTER — Encounter: Payer: Self-pay | Admitting: Internal Medicine

## 2014-03-13 VITALS — BP 140/64 | HR 68 | Temp 98.4°F | Resp 16 | Wt 274.0 lb

## 2014-03-13 DIAGNOSIS — E039 Hypothyroidism, unspecified: Secondary | ICD-10-CM | POA: Diagnosis not present

## 2014-03-13 DIAGNOSIS — D6959 Other secondary thrombocytopenia: Secondary | ICD-10-CM | POA: Diagnosis not present

## 2014-03-13 DIAGNOSIS — L409 Psoriasis, unspecified: Secondary | ICD-10-CM

## 2014-03-13 DIAGNOSIS — K7460 Unspecified cirrhosis of liver: Secondary | ICD-10-CM

## 2014-03-13 DIAGNOSIS — Z23 Encounter for immunization: Secondary | ICD-10-CM

## 2014-03-13 DIAGNOSIS — D731 Hypersplenism: Secondary | ICD-10-CM

## 2014-03-13 DIAGNOSIS — I10 Essential (primary) hypertension: Secondary | ICD-10-CM

## 2014-03-13 LAB — HEPATIC FUNCTION PANEL
ALT: 18 U/L (ref 0–35)
AST: 33 U/L (ref 0–37)
Albumin: 2.7 g/dL — ABNORMAL LOW (ref 3.5–5.2)
Alkaline Phosphatase: 134 U/L — ABNORMAL HIGH (ref 39–117)
BILIRUBIN DIRECT: 0.7 mg/dL — AB (ref 0.0–0.3)
TOTAL PROTEIN: 7.6 g/dL (ref 6.0–8.3)
Total Bilirubin: 2.5 mg/dL — ABNORMAL HIGH (ref 0.2–1.2)

## 2014-03-13 LAB — CBC WITH DIFFERENTIAL/PLATELET
BASOS ABS: 0 10*3/uL (ref 0.0–0.1)
BASOS PCT: 0.5 % (ref 0.0–3.0)
EOS ABS: 0.1 10*3/uL (ref 0.0–0.7)
Eosinophils Relative: 4.2 % (ref 0.0–5.0)
HEMATOCRIT: 36.8 % (ref 36.0–46.0)
HEMOGLOBIN: 12.2 g/dL (ref 12.0–15.0)
LYMPHS ABS: 0.6 10*3/uL — AB (ref 0.7–4.0)
Lymphocytes Relative: 19.3 % (ref 12.0–46.0)
MCHC: 33.3 g/dL (ref 30.0–36.0)
MCV: 93.7 fl (ref 78.0–100.0)
Monocytes Absolute: 0.3 10*3/uL (ref 0.1–1.0)
Monocytes Relative: 7.9 % (ref 3.0–12.0)
NEUTROS ABS: 2.2 10*3/uL (ref 1.4–7.7)
Neutrophils Relative %: 68.1 % (ref 43.0–77.0)
Platelets: 62 10*3/uL — ABNORMAL LOW (ref 150.0–400.0)
RBC: 3.93 Mil/uL (ref 3.87–5.11)
RDW: 14.6 % (ref 11.5–15.5)
WBC: 3.3 10*3/uL — ABNORMAL LOW (ref 4.0–10.5)

## 2014-03-13 LAB — BASIC METABOLIC PANEL
BUN: 8 mg/dL (ref 6–23)
CALCIUM: 9.2 mg/dL (ref 8.4–10.5)
CO2: 30 mEq/L (ref 19–32)
Chloride: 105 mEq/L (ref 96–112)
Creatinine, Ser: 0.7 mg/dL (ref 0.4–1.2)
GFR: 90.83 mL/min (ref >60.00)
Glucose, Bld: 96 mg/dL (ref 70–99)
Potassium: 3.6 mEq/L (ref 3.5–5.1)
Sodium: 137 mEq/L (ref 135–145)

## 2014-03-13 LAB — PROTIME-INR
INR: 1.5 ratio — ABNORMAL HIGH (ref 0.8–1.0)
PROTHROMBIN TIME: 17 s — AB (ref 9.6–13.1)

## 2014-03-13 MED ORDER — HYDROCODONE-ACETAMINOPHEN 7.5-325 MG PO TABS: ORAL_TABLET | ORAL | Status: DC

## 2014-03-13 MED ORDER — TEMAZEPAM 15 MG PO CAPS: 15.0000 mg | ORAL_CAPSULE | Freq: Every evening | ORAL | Status: DC | PRN

## 2014-03-13 NOTE — Progress Notes (Signed)
   Subjective:    HPI   F/u more stress w/sick husband - he had an aneurism surgery - has a descending aopta aneurism too. Pt has put wt on. Her dtr lost her 3d baby...  The patient is here to follow up on chronic HTN, hypothyroidism, psoriasis, depression, anxiety, LBP  and chronic moderate fibromyalgia symptoms partially controlled with medicines. Her PLTs were at 74K w/her Derm  Wt Readings from Last 3 Encounters:  03/13/14 274 lb (124.286 kg)  12/10/13 274 lb (124.286 kg)  10/07/13 278 lb (126.1 kg)   BP Readings from Last 3 Encounters:  03/13/14 140/64  12/10/13 130/70  10/07/13 140/70      Review of Systems  Constitutional: Positive for unexpected weight change. Negative for chills, activity change, appetite change and fatigue.  HENT: Negative for congestion, mouth sores and sinus pressure.   Eyes: Negative for visual disturbance.  Respiratory: Negative for cough and chest tightness.   Gastrointestinal: Negative for nausea and abdominal pain.  Genitourinary: Negative for frequency, difficulty urinating and vaginal pain.  Musculoskeletal: Negative for back pain and gait problem.  Skin: Positive for rash. Negative for pallor.  Neurological: Negative for dizziness, tremors, weakness, numbness and headaches.  Psychiatric/Behavioral: Positive for sleep disturbance. Negative for suicidal ideas and confusion. The patient is nervous/anxious.        Objective:   Physical Exam  Constitutional: She appears well-developed. No distress.  Obese  HENT:  Head: Normocephalic.  Right Ear: External ear normal.  Left Ear: External ear normal.  Nose: Nose normal.  Mouth/Throat: Oropharynx is clear and moist.  Eyes: Conjunctivae are normal. Pupils are equal, round, and reactive to light. Right eye exhibits no discharge. Left eye exhibits no discharge.  Neck: Normal range of motion. Neck supple. No JVD present. No tracheal deviation present. No thyromegaly present.  Cardiovascular:  Normal rate, regular rhythm and normal heart sounds.   Pulmonary/Chest: No stridor. No respiratory distress. She has no wheezes.  Abdominal: Soft. Bowel sounds are normal. She exhibits no distension and no mass. There is no tenderness. There is no rebound and no guarding.  Musculoskeletal: She exhibits no edema and no tenderness.  Lymphadenopathy:    She has no cervical adenopathy.  Neurological: She displays normal reflexes. No cranial nerve deficit. She exhibits normal muscle tone. Coordination normal.  Skin: Rash noted. There is erythema.  Extensive psoriasis  Psychiatric: She has a normal mood and affect. Her behavior is normal. Judgment and thought content normal.    Lab Results  Component Value Date   WBC 2.8* 12/10/2013   HGB 12.2 12/10/2013   HCT 35.7* 12/10/2013   PLT 64.0* 12/10/2013   GLUCOSE 100* 12/10/2013   CHOL 164 06/02/2010   TRIG 97.0 06/02/2010   HDL 43.50 06/02/2010   LDLCALC 101* 06/02/2010   ALT 19 10/07/2013   AST 34 10/07/2013   NA 140 12/10/2013   K 4.0 12/10/2013   CL 105 12/10/2013   CREATININE 0.8 12/10/2013   BUN 7 12/10/2013   CO2 31 12/10/2013   TSH 1.54 09/11/2012   INR 1.4* 10/07/2013   HGBA1C 4.6 12/10/2013         Assessment & Plan:

## 2014-03-13 NOTE — Assessment & Plan Note (Signed)
Continue with current prescription therapy as reflected on the Med list.  

## 2014-03-13 NOTE — Progress Notes (Signed)
Pre visit review using our clinic review tool, if applicable. No additional management support is needed unless otherwise documented below in the visit note. 

## 2014-03-13 NOTE — Assessment & Plan Note (Signed)
F/u w/GI 

## 2014-03-13 NOTE — Assessment & Plan Note (Signed)
CBC

## 2014-03-16 ENCOUNTER — Telehealth: Payer: Self-pay | Admitting: Internal Medicine

## 2014-03-16 NOTE — Telephone Encounter (Signed)
emmi emailed °

## 2014-06-08 ENCOUNTER — Telehealth: Payer: Self-pay | Admitting: *Deleted

## 2014-06-08 MED ORDER — AZITHROMYCIN 250 MG PO TABS: ORAL_TABLET | ORAL | Status: DC

## 2014-06-08 NOTE — Telephone Encounter (Signed)
Ok zpac Thx 

## 2014-06-08 NOTE — Telephone Encounter (Signed)
Left msg on triage stating coming in on 1/11/, but she has a sinus infection. Blowing green mucus & head feel full with sinus pain. Wanting to see would md rx zpack don't want to wait until 1/11...[name]

## 2014-06-09 NOTE — Telephone Encounter (Signed)
Notified pt md sent zpac to [facility]...[name]

## 2014-06-15 ENCOUNTER — Other Ambulatory Visit (INDEPENDENT_AMBULATORY_CARE_PROVIDER_SITE_OTHER): Payer: 59

## 2014-06-15 ENCOUNTER — Ambulatory Visit (INDEPENDENT_AMBULATORY_CARE_PROVIDER_SITE_OTHER): Payer: 59 | Admitting: Internal Medicine

## 2014-06-15 ENCOUNTER — Encounter: Payer: Self-pay | Admitting: Internal Medicine

## 2014-06-15 VITALS — BP 140/62 | HR 82 | Temp 97.9°F | Wt 269.0 lb

## 2014-06-15 DIAGNOSIS — F329 Major depressive disorder, single episode, unspecified: Secondary | ICD-10-CM

## 2014-06-15 DIAGNOSIS — K7460 Unspecified cirrhosis of liver: Secondary | ICD-10-CM

## 2014-06-15 DIAGNOSIS — E038 Other specified hypothyroidism: Secondary | ICD-10-CM

## 2014-06-15 DIAGNOSIS — E034 Atrophy of thyroid (acquired): Secondary | ICD-10-CM

## 2014-06-15 DIAGNOSIS — F32A Depression, unspecified: Secondary | ICD-10-CM

## 2014-06-15 DIAGNOSIS — I10 Essential (primary) hypertension: Secondary | ICD-10-CM

## 2014-06-15 LAB — BASIC METABOLIC PANEL
BUN: 7 mg/dL (ref 6–23)
CHLORIDE: 106 meq/L (ref 96–112)
CO2: 28 mEq/L (ref 19–32)
CREATININE: 0.7 mg/dL (ref 0.4–1.2)
Calcium: 8.7 mg/dL (ref 8.4–10.5)
GFR: 87.81 mL/min (ref >60.00)
Glucose, Bld: 91 mg/dL (ref 70–99)
Potassium: 3.8 mEq/L (ref 3.5–5.1)
Sodium: 138 mEq/L (ref 135–145)

## 2014-06-15 LAB — CBC WITH DIFFERENTIAL/PLATELET
BASOS ABS: 0 10*3/uL (ref 0.0–0.1)
Basophils Relative: 0.4 % (ref 0.0–3.0)
Eosinophils Absolute: 0.1 10*3/uL (ref 0.0–0.7)
Eosinophils Relative: 3.1 % (ref 0.0–5.0)
HEMATOCRIT: 37.6 % (ref 36.0–46.0)
HEMOGLOBIN: 12.5 g/dL (ref 12.0–15.0)
Lymphocytes Relative: 17.1 % (ref 12.0–46.0)
Lymphs Abs: 0.6 10*3/uL — ABNORMAL LOW (ref 0.7–4.0)
MCHC: 33.2 g/dL (ref 30.0–36.0)
MCV: 95.7 fl (ref 78.0–100.0)
Monocytes Absolute: 0.3 10*3/uL (ref 0.1–1.0)
Monocytes Relative: 6.9 % (ref 3.0–12.0)
Neutro Abs: 2.7 10*3/uL (ref 1.4–7.7)
Neutrophils Relative %: 72.5 % (ref 43.0–77.0)
Platelets: 71 10*3/uL — ABNORMAL LOW (ref 150.0–400.0)
RBC: 3.93 Mil/uL (ref 3.87–5.11)
RDW: 15.7 % — ABNORMAL HIGH (ref 11.5–15.5)
WBC: 3.7 10*3/uL — ABNORMAL LOW (ref 4.0–10.5)

## 2014-06-15 LAB — HEPATIC FUNCTION PANEL
ALT: 25 U/L (ref 0–35)
AST: 39 U/L — ABNORMAL HIGH (ref 0–37)
Albumin: 3.1 g/dL — ABNORMAL LOW (ref 3.5–5.2)
Alkaline Phosphatase: 131 U/L — ABNORMAL HIGH (ref 39–117)
Bilirubin, Direct: 0.8 mg/dL — ABNORMAL HIGH (ref 0.0–0.3)
Total Bilirubin: 3.3 mg/dL — ABNORMAL HIGH (ref 0.2–1.2)
Total Protein: 7.3 g/dL (ref 6.0–8.3)

## 2014-06-15 MED ORDER — HYDROCODONE-ACETAMINOPHEN 7.5-325 MG PO TABS: ORAL_TABLET | ORAL | Status: DC

## 2014-06-15 NOTE — Assessment & Plan Note (Signed)
Low carb diet 

## 2014-06-15 NOTE — Assessment & Plan Note (Signed)
Continue with current prescription therapy as reflected on the Med list.  

## 2014-06-15 NOTE — Progress Notes (Signed)
   Subjective:    HPI   F/u more stress w/sick husband - he had an aneurism surgery - has a descending aopta aneurism too - surgery pending.  Her dtr lost her 3d baby...  The patient is here to follow up on chronic HTN, hypothyroidism, psoriasis, depression, anxiety, LBP  and chronic moderate fibromyalgia symptoms partially controlled with medicines. Her PLTs were at 74K w/her Derm  Wt Readings from Last 3 Encounters:  06/15/14 269 lb (122.018 kg)  03/13/14 274 lb (124.286 kg)  12/10/13 274 lb (124.286 kg)   BP Readings from Last 3 Encounters:  06/15/14 140/62  03/13/14 140/64  12/10/13 130/70      Review of Systems  Constitutional: Positive for unexpected weight change. Negative for chills, activity change, appetite change and fatigue.  HENT: Negative for congestion, mouth sores and sinus pressure.   Eyes: Negative for visual disturbance.  Respiratory: Negative for cough and chest tightness.   Gastrointestinal: Negative for nausea and abdominal pain.  Genitourinary: Negative for frequency, difficulty urinating and vaginal pain.  Musculoskeletal: Negative for back pain and gait problem.  Skin: Positive for rash. Negative for pallor.  Neurological: Negative for dizziness, tremors, weakness, numbness and headaches.  Psychiatric/Behavioral: Positive for sleep disturbance. Negative for suicidal ideas and confusion. The patient is nervous/anxious.        Objective:   Physical Exam  Constitutional: She appears well-developed. No distress.  HENT:  Head: Normocephalic.  Right Ear: External ear normal.  Left Ear: External ear normal.  Nose: Nose normal.  Mouth/Throat: Oropharynx is clear and moist.  Eyes: Conjunctivae are normal. Pupils are equal, round, and reactive to light. Right eye exhibits no discharge. Left eye exhibits no discharge.  Neck: Normal range of motion. Neck supple. No JVD present. No tracheal deviation present. No thyromegaly present.  Cardiovascular:  Normal rate, regular rhythm and normal heart sounds.   Pulmonary/Chest: No stridor. No respiratory distress. She has no wheezes.  Abdominal: Soft. Bowel sounds are normal. She exhibits no distension and no mass. There is no tenderness. There is no rebound and no guarding.  Musculoskeletal: She exhibits no edema or tenderness.  Lymphadenopathy:    She has no cervical adenopathy.  Neurological: She displays normal reflexes. No cranial nerve deficit. She exhibits normal muscle tone. Coordination normal.  Skin: Rash noted. No erythema.  Psychiatric: She has a normal mood and affect. Her behavior is normal. Judgment and thought content normal.    Lab Results  Component Value Date   WBC 3.3* 03/13/2014   HGB 12.2 03/13/2014   HCT 36.8 03/13/2014   PLT 62.0* 03/13/2014   GLUCOSE 96 03/13/2014   CHOL 164 06/02/2010   TRIG 97.0 06/02/2010   HDL 43.50 06/02/2010   LDLCALC 101* 06/02/2010   ALT 18 03/13/2014   AST 33 03/13/2014   NA 137 03/13/2014   K 3.6 03/13/2014   CL 105 03/13/2014   CREATININE 0.7 03/13/2014   BUN 8 03/13/2014   CO2 30 03/13/2014   TSH 1.54 09/11/2012   INR 1.5* 03/13/2014   HGBA1C 4.6 12/10/2013         Assessment & Plan:  Patient ID: Lori Mills, female   DOB: 1950/08/12, 64 y.o.   MRN: [id number]

## 2014-06-15 NOTE — Patient Instructions (Signed)
Low carb diet 

## 2014-06-15 NOTE — Progress Notes (Signed)
Pre visit review using our clinic review tool, if applicable. No additional management support is needed unless otherwise documented below in the visit note. 

## 2014-07-13 ENCOUNTER — Telehealth: Payer: Self-pay | Admitting: Internal Medicine

## 2014-07-13 MED ORDER — HYDROCODONE-ACETAMINOPHEN 7.5-325 MG PO TABS: ORAL_TABLET | ORAL | Status: DC

## 2014-07-13 NOTE — Telephone Encounter (Signed)
Ok Thx 

## 2014-07-13 NOTE — Telephone Encounter (Signed)
Notified pt rx ready for pick-up. She will bring the old rx back...lmb

## 2014-07-13 NOTE — Telephone Encounter (Signed)
Patient would like to know if its ok to come pick up.

## 2014-07-13 NOTE — Telephone Encounter (Signed)
Patient has a script for hydrocodone.  She states its not set to refill until 2/10.  Patient states she has been in a lot of pain b/c she has had to lift her husband a lot this month.  She is requesting a script to pick up today.  She has one pill left she has been saving.

## 2014-07-30 ENCOUNTER — Other Ambulatory Visit: Payer: Self-pay | Admitting: *Deleted

## 2014-07-30 ENCOUNTER — Encounter: Payer: Self-pay | Admitting: Internal Medicine

## 2014-07-30 MED ORDER — LORATADINE 10 MG PO TABS: 10.0000 mg | ORAL_TABLET | Freq: Every day | ORAL | Status: AC

## 2014-07-30 NOTE — Telephone Encounter (Signed)
Sent email requesting refill to be sent on her loratadine...[name]

## 2014-09-15 ENCOUNTER — Ambulatory Visit: Payer: 59 | Admitting: Internal Medicine

## 2014-09-16 ENCOUNTER — Ambulatory Visit (INDEPENDENT_AMBULATORY_CARE_PROVIDER_SITE_OTHER): Payer: Medicare PPO | Admitting: Internal Medicine

## 2014-09-16 ENCOUNTER — Other Ambulatory Visit (INDEPENDENT_AMBULATORY_CARE_PROVIDER_SITE_OTHER): Payer: Medicare PPO

## 2014-09-16 ENCOUNTER — Encounter: Payer: Self-pay | Admitting: Internal Medicine

## 2014-09-16 VITALS — BP 148/64 | HR 91 | Temp 97.5°F | Resp 18 | Wt 283.0 lb

## 2014-09-16 DIAGNOSIS — E034 Atrophy of thyroid (acquired): Secondary | ICD-10-CM

## 2014-09-16 DIAGNOSIS — E038 Other specified hypothyroidism: Secondary | ICD-10-CM

## 2014-09-16 DIAGNOSIS — D6959 Other secondary thrombocytopenia: Secondary | ICD-10-CM

## 2014-09-16 DIAGNOSIS — M5442 Lumbago with sciatica, left side: Secondary | ICD-10-CM

## 2014-09-16 DIAGNOSIS — R739 Hyperglycemia, unspecified: Secondary | ICD-10-CM

## 2014-09-16 DIAGNOSIS — I10 Essential (primary) hypertension: Secondary | ICD-10-CM

## 2014-09-16 DIAGNOSIS — M5441 Lumbago with sciatica, right side: Secondary | ICD-10-CM

## 2014-09-16 DIAGNOSIS — D731 Hypersplenism: Secondary | ICD-10-CM

## 2014-09-16 DIAGNOSIS — G4700 Insomnia, unspecified: Secondary | ICD-10-CM

## 2014-09-16 LAB — HEPATIC FUNCTION PANEL
ALT: 17 U/L (ref 0–35)
AST: 31 U/L (ref 0–37)
Albumin: 2.9 g/dL — ABNORMAL LOW (ref 3.5–5.2)
Alkaline Phosphatase: 129 U/L — ABNORMAL HIGH (ref 39–117)
BILIRUBIN TOTAL: 3.6 mg/dL — AB (ref 0.2–1.2)
Bilirubin, Direct: 1.3 mg/dL — ABNORMAL HIGH (ref 0.0–0.3)
Total Protein: 6.5 g/dL (ref 6.0–8.3)

## 2014-09-16 LAB — CBC WITH DIFFERENTIAL/PLATELET
Basophils Absolute: 0 10*3/uL (ref 0.0–0.1)
Basophils Relative: 0.7 % (ref 0.0–3.0)
EOS ABS: 0.1 10*3/uL (ref 0.0–0.7)
Eosinophils Relative: 3.3 % (ref 0.0–5.0)
HCT: 32.4 % — ABNORMAL LOW (ref 36.0–46.0)
Hemoglobin: 11.2 g/dL — ABNORMAL LOW (ref 12.0–15.0)
LYMPHS ABS: 0.6 10*3/uL — AB (ref 0.7–4.0)
Lymphocytes Relative: 22.9 % (ref 12.0–46.0)
MCHC: 34.4 g/dL (ref 30.0–36.0)
MCV: 93.1 fl (ref 78.0–100.0)
MONO ABS: 0.2 10*3/uL (ref 0.1–1.0)
Monocytes Relative: 6.8 % (ref 3.0–12.0)
NEUTROS PCT: 66.3 % (ref 43.0–77.0)
Neutro Abs: 1.7 10*3/uL (ref 1.4–7.7)
PLATELETS: 51 10*3/uL — AB (ref 150.0–400.0)
RBC: 3.48 Mil/uL — ABNORMAL LOW (ref 3.87–5.11)
RDW: 15.3 % (ref 11.5–15.5)
WBC: 2.6 10*3/uL — ABNORMAL LOW (ref 4.0–10.5)

## 2014-09-16 LAB — TESTOSTERONE: Testosterone: 54.97 ng/dL — ABNORMAL HIGH (ref 15.00–40.00)

## 2014-09-16 LAB — BASIC METABOLIC PANEL
BUN: 11 mg/dL (ref 6–23)
CHLORIDE: 105 meq/L (ref 96–112)
CO2: 29 mEq/L (ref 19–32)
CREATININE: 0.72 mg/dL (ref 0.40–1.20)
Calcium: 9.1 mg/dL (ref 8.4–10.5)
GFR: 86.34 mL/min (ref >60.00)
Glucose, Bld: 111 mg/dL — ABNORMAL HIGH (ref 70–99)
Potassium: 3.9 mEq/L (ref 3.5–5.1)
Sodium: 138 mEq/L (ref 135–145)

## 2014-09-16 LAB — TSH: TSH: 1.96 u[IU]/mL (ref 0.35–4.50)

## 2014-09-16 LAB — HEMOGLOBIN A1C: Hgb A1c MFr Bld: 4.1 % — ABNORMAL LOW (ref 4.6–6.5)

## 2014-09-16 MED ORDER — POTASSIUM CHLORIDE ER 10 MEQ PO TBCR: 10.0000 meq | EXTENDED_RELEASE_TABLET | Freq: Every day | ORAL | Status: DC

## 2014-09-16 MED ORDER — HYDROCODONE-ACETAMINOPHEN 7.5-325 MG PO TABS: ORAL_TABLET | ORAL | Status: DC

## 2014-09-16 MED ORDER — FUROSEMIDE 40 MG PO TABS: ORAL_TABLET | ORAL | Status: DC

## 2014-09-16 MED ORDER — HYDROCODONE-ACETAMINOPHEN 7.5-325 MG PO TABS
ORAL_TABLET | ORAL | Status: DC
Start: 2014-09-16 — End: 2014-12-21

## 2014-09-16 NOTE — Assessment & Plan Note (Signed)
CBC

## 2014-09-16 NOTE — Assessment & Plan Note (Signed)
On levothroid 

## 2014-09-16 NOTE — Progress Notes (Signed)
   Subjective:    HPI   F/u more stress w/sick husband - he had an aneurism surgery and LBP w/complications - has a descending aopta aneurism surgery too - pt gained wt.  Her dtr lost her 3d baby...  The patient is here to follow up on chronic HTN, hypothyroidism, psoriasis, depression, anxiety, LBP  and chronic moderate fibromyalgia symptoms partially controlled with medicines. Her PLTs were at 74K w/her Derm  Wt Readings from Last 3 Encounters:  09/16/14 283 lb (128.368 kg)  06/15/14 269 lb (122.018 kg)  03/13/14 274 lb (124.286 kg)   BP Readings from Last 3 Encounters:  09/16/14 148/64  06/15/14 140/62  03/13/14 140/64      Review of Systems  Constitutional: Positive for unexpected weight change. Negative for chills, activity change, appetite change and fatigue.  HENT: Negative for congestion, mouth sores and sinus pressure.   Eyes: Negative for visual disturbance.  Respiratory: Negative for cough and chest tightness.   Cardiovascular: Positive for leg swelling.  Gastrointestinal: Negative for nausea, abdominal pain and diarrhea.  Genitourinary: Negative for frequency, difficulty urinating and vaginal pain.  Musculoskeletal: Negative for back pain and gait problem.  Skin: Positive for rash. Negative for pallor.  Neurological: Negative for dizziness, tremors, weakness, numbness and headaches.  Psychiatric/Behavioral: Positive for sleep disturbance. Negative for suicidal ideas and confusion. The patient is nervous/anxious.        Objective:   Physical Exam  Constitutional: She appears well-developed. No distress.  HENT:  Head: Normocephalic.  Right Ear: External ear normal.  Left Ear: External ear normal.  Nose: Nose normal.  Mouth/Throat: Oropharynx is clear and moist.  Eyes: Conjunctivae are normal. Pupils are equal, round, and reactive to light. Right eye exhibits no discharge. Left eye exhibits no discharge.  Neck: Normal range of motion. Neck supple. No JVD  present. No tracheal deviation present. No thyromegaly present.  Cardiovascular: Normal rate, regular rhythm and normal heart sounds.   Pulmonary/Chest: No stridor. No respiratory distress. She has no wheezes. She exhibits no tenderness.  Abdominal: Soft. Bowel sounds are normal. She exhibits no distension and no mass. There is no tenderness. There is no rebound and no guarding.  Musculoskeletal: She exhibits edema. She exhibits no tenderness.  Lymphadenopathy:    She has no cervical adenopathy.  Neurological: She displays normal reflexes. No cranial nerve deficit. She exhibits normal muscle tone. Coordination normal.  Skin: Rash noted. There is erythema.  Psychiatric: She has a normal mood and affect. Her behavior is normal. Judgment and thought content normal.  LS is tender  Lab Results  Component Value Date   WBC 3.7* 06/15/2014   HGB 12.5 06/15/2014   HCT 37.6 06/15/2014   PLT 71.0* 06/15/2014   GLUCOSE 91 06/15/2014   CHOL 164 06/02/2010   TRIG 97.0 06/02/2010   HDL 43.50 06/02/2010   LDLCALC 101* 06/02/2010   ALT 25 06/15/2014   AST 39* 06/15/2014   NA 138 06/15/2014   K 3.8 06/15/2014   CL 106 06/15/2014   CREATININE 0.7 06/15/2014   BUN 7 06/15/2014   CO2 28 06/15/2014   TSH 1.54 09/11/2012   INR 1.5* 03/13/2014   HGBA1C 4.6 12/10/2013         Assessment & Plan:  Patient ID: Lori Mills, female   DOB: 1950/07/05, 64 y.o.   MRN: [id number]

## 2014-09-16 NOTE — Assessment & Plan Note (Signed)
On Losartan 

## 2014-09-16 NOTE — Assessment & Plan Note (Signed)
Chronic On Temazepam  Potential benefits of a long term benzodiazepines  use as well as potential risks  and complications were explained to the patient and were aknowledged.

## 2014-09-16 NOTE — Progress Notes (Signed)
Pre visit review using our clinic review tool, if applicable. No additional management support is needed unless otherwise documented below in the visit note. 

## 2014-09-16 NOTE — Assessment & Plan Note (Signed)
Wt Readings from Last 3 Encounters:  09/16/14 283 lb (128.368 kg)  06/15/14 269 lb (122.018 kg)  03/13/14 274 lb (124.286 kg)

## 2014-09-16 NOTE — Assessment & Plan Note (Signed)
Chronic  On Norco  Potential benefits of a long term opioids use as well as potential risks (i.e. addiction risk, apnea etc) and complications (i.e. Somnolence, constipation and others) were explained to the patient and were aknowledged. 

## 2014-11-18 ENCOUNTER — Other Ambulatory Visit: Payer: Self-pay | Admitting: Internal Medicine

## 2014-11-30 ENCOUNTER — Other Ambulatory Visit: Payer: Self-pay

## 2014-12-04 ENCOUNTER — Ambulatory Visit: Payer: Medicare PPO | Admitting: Internal Medicine

## 2014-12-14 ENCOUNTER — Ambulatory Visit: Payer: Medicare PPO | Admitting: Internal Medicine

## 2014-12-15 ENCOUNTER — Other Ambulatory Visit (INDEPENDENT_AMBULATORY_CARE_PROVIDER_SITE_OTHER): Payer: Medicare PPO

## 2014-12-15 ENCOUNTER — Ambulatory Visit: Payer: Medicare PPO | Admitting: Internal Medicine

## 2014-12-15 ENCOUNTER — Encounter: Payer: Self-pay | Admitting: Internal Medicine

## 2014-12-15 DIAGNOSIS — E038 Other specified hypothyroidism: Secondary | ICD-10-CM | POA: Diagnosis not present

## 2014-12-15 DIAGNOSIS — K7460 Unspecified cirrhosis of liver: Secondary | ICD-10-CM

## 2014-12-15 DIAGNOSIS — I10 Essential (primary) hypertension: Secondary | ICD-10-CM

## 2014-12-15 DIAGNOSIS — F329 Major depressive disorder, single episode, unspecified: Secondary | ICD-10-CM

## 2014-12-15 DIAGNOSIS — R739 Hyperglycemia, unspecified: Secondary | ICD-10-CM | POA: Diagnosis not present

## 2014-12-15 DIAGNOSIS — E034 Atrophy of thyroid (acquired): Secondary | ICD-10-CM

## 2014-12-15 DIAGNOSIS — F32A Depression, unspecified: Secondary | ICD-10-CM

## 2014-12-15 LAB — CBC WITH DIFFERENTIAL/PLATELET
BASOS PCT: 1.8 % (ref 0.0–3.0)
Basophils Absolute: 0 10*3/uL (ref 0.0–0.1)
EOS PCT: 4 % (ref 0.0–5.0)
Eosinophils Absolute: 0.1 10*3/uL (ref 0.0–0.7)
HEMATOCRIT: 34.8 % — AB (ref 36.0–46.0)
Hemoglobin: 11.9 g/dL — ABNORMAL LOW (ref 12.0–15.0)
LYMPHS ABS: 0.5 10*3/uL — AB (ref 0.7–4.0)
Lymphocytes Relative: 21.6 % (ref 12.0–46.0)
MCHC: 34.1 g/dL (ref 30.0–36.0)
MCV: 95 fl (ref 78.0–100.0)
MONO ABS: 0.3 10*3/uL (ref 0.1–1.0)
Monocytes Relative: 10.5 % (ref 3.0–12.0)
NEUTROS ABS: 1.5 10*3/uL (ref 1.4–7.7)
Neutrophils Relative %: 62.1 % (ref 43.0–77.0)
Platelets: 56 10*3/uL — ABNORMAL LOW (ref 150.0–400.0)
RBC: 3.66 Mil/uL — AB (ref 3.87–5.11)
RDW: 15.5 % (ref 11.5–15.5)

## 2014-12-15 LAB — HEPATIC FUNCTION PANEL
ALBUMIN: 3 g/dL — AB (ref 3.5–5.2)
ALK PHOS: 153 U/L — AB (ref 39–117)
ALT: 22 U/L (ref 0–35)
AST: 38 U/L — ABNORMAL HIGH (ref 0–37)
BILIRUBIN TOTAL: 3.7 mg/dL — AB (ref 0.2–1.2)
Bilirubin, Direct: 1.2 mg/dL — ABNORMAL HIGH (ref 0.0–0.3)
Total Protein: 6.8 g/dL (ref 6.0–8.3)

## 2014-12-15 LAB — BASIC METABOLIC PANEL
BUN: 10 mg/dL (ref 6–23)
CO2: 31 mEq/L (ref 19–32)
CREATININE: 0.68 mg/dL (ref 0.40–1.20)
Calcium: 9.1 mg/dL (ref 8.4–10.5)
Chloride: 107 mEq/L (ref 96–112)
GFR: 92.15 mL/min (ref >60.00)
Glucose, Bld: 87 mg/dL (ref 70–99)
POTASSIUM: 3.8 meq/L (ref 3.5–5.1)
SODIUM: 141 meq/L (ref 135–145)

## 2014-12-15 LAB — HEMOGLOBIN A1C: Hgb A1c MFr Bld: 3.9 % — ABNORMAL LOW (ref 4.6–6.5)

## 2014-12-16 ENCOUNTER — Ambulatory Visit: Payer: Medicare PPO | Admitting: Internal Medicine

## 2014-12-21 ENCOUNTER — Ambulatory Visit (INDEPENDENT_AMBULATORY_CARE_PROVIDER_SITE_OTHER): Payer: Medicare PPO | Admitting: Internal Medicine

## 2014-12-21 ENCOUNTER — Encounter: Payer: Self-pay | Admitting: Internal Medicine

## 2014-12-21 VITALS — BP 130/72 | HR 76 | Wt 279.0 lb

## 2014-12-21 DIAGNOSIS — K7460 Unspecified cirrhosis of liver: Secondary | ICD-10-CM | POA: Diagnosis not present

## 2014-12-21 DIAGNOSIS — I10 Essential (primary) hypertension: Secondary | ICD-10-CM | POA: Diagnosis not present

## 2014-12-21 DIAGNOSIS — D6959 Other secondary thrombocytopenia: Secondary | ICD-10-CM

## 2014-12-21 DIAGNOSIS — D731 Hypersplenism: Secondary | ICD-10-CM

## 2014-12-21 DIAGNOSIS — D693 Immune thrombocytopenic purpura: Secondary | ICD-10-CM

## 2014-12-21 DIAGNOSIS — E162 Hypoglycemia, unspecified: Secondary | ICD-10-CM

## 2014-12-21 MED ORDER — OXYCODONE HCL 5 MG PO TABS
5.0000 mg | ORAL_TABLET | Freq: Four times a day (QID) | ORAL | Status: DC | PRN
Start: 2014-12-21 — End: 2014-12-21

## 2014-12-21 MED ORDER — OXYCODONE HCL 5 MG PO TABS: 5.0000 mg | ORAL_TABLET | Freq: Four times a day (QID) | ORAL | Status: DC | PRN

## 2014-12-21 NOTE — Progress Notes (Signed)
Pre visit review using our clinic review tool, if applicable. No additional management support is needed unless otherwise documented below in the visit note. 

## 2014-12-21 NOTE — Assessment & Plan Note (Addendum)
Worse. F/u w/Dr Julieta Gutting.  Pt was told she is not a candidate for a liver transplant

## 2014-12-21 NOTE — Assessment & Plan Note (Signed)
Worse. Hem cons Dr Myna Hidalgo

## 2014-12-21 NOTE — Assessment & Plan Note (Signed)
Worse. Hem cons Dr Ennever 

## 2014-12-21 NOTE — Patient Instructions (Signed)
Due to liver disease Small frequent meals with complex carbs Use Agave syrup 

## 2014-12-21 NOTE — Assessment & Plan Note (Signed)
Due to liver disease Small frequent meals with complex carbs Use Agave syrup

## 2014-12-21 NOTE — Assessment & Plan Note (Signed)
On Losartan 

## 2014-12-21 NOTE — Progress Notes (Signed)
Subjective:  Patient ID: Lori Mills, female    DOB: Jan 04, 1951  Age: 64 y.o. MRN: [id number]  CC: No chief complaint on file.   HPI   Lori Mills presents for chronic HTN, hypothyroidism, psoriasis, depression, anxiety, LBP and chronic moderate fibromyalgia symptoms f/u. Lori Mills's husband is ?terminal w/brain hemorrhge   Outpatient Prescriptions Prior to Visit  Medication Sig Dispense Refill  . Cholecalciferol (VITAMIN D3) 1000 UNITS tablet Take 1,000 Units by mouth daily.      . furosemide (LASIX) 40 MG tablet take 1 tablet by mouth if needed (WATER PILL) 30 tablet 5  . gabapentin (NEURONTIN) 100 MG capsule take 1 capsule by mouth twice a day OR as directed 60 capsule 5  . HYDROcodone-acetaminophen (NORCO) 7.5-325 MG per tablet take 1 tablet by mouth every 4 hours if needed for pain 120 tablet 0  . Lactobacillus (ACIDOPHILUS) CAPS Take by mouth as needed.      Marland Kitchen levothyroxine (SYNTHROID, LEVOTHROID) 50 MCG tablet take 1 tablet by mouth once daily 30 tablet 11  . loratadine (CLARITIN) 10 MG tablet Take 1 tablet (10 mg total) by mouth daily. For allergies 100 tablet 3  . losartan (COZAAR) 100 MG tablet take 1 tablet by mouth once daily 30 tablet 11  . potassium chloride (KLOR-CON 10) 10 MEQ tablet Take 1 tablet (10 mEq total) by mouth daily. 30 tablet 5  . temazepam (RESTORIL) 15 MG capsule Take 1-2 capsules (15-30 mg total) by mouth at bedtime as needed for sleep. For Insomnia 60 capsule 3  . triamcinolone cream (KENALOG) 0.1 % Apply topically 2 (two) times daily. 30 g 1  . vitamin B-12 (CYANOCOBALAMIN) 1000 MCG tablet Take 1,000 mcg by mouth daily.       No facility-administered medications prior to visit.    ROS Review of Systems  Constitutional: Positive for fatigue. Negative for chills, activity change, appetite change and unexpected weight change.  HENT: Negative for congestion, mouth sores and sinus pressure.   Eyes: Negative for visual disturbance.    Respiratory: Negative for cough and chest tightness.   Gastrointestinal: Negative for nausea, abdominal pain and diarrhea.  Genitourinary: Negative for frequency, difficulty urinating and vaginal pain.  Musculoskeletal: Positive for back pain, arthralgias and gait problem.  Skin: Positive for rash. Negative for pallor.  Neurological: Negative for dizziness, tremors, weakness, numbness and headaches.  Psychiatric/Behavioral: Negative for suicidal ideas, behavioral problems, confusion, sleep disturbance and dysphoric mood. The patient is nervous/anxious.     Objective:  BP 130/72 mmHg  Pulse 76  Wt 279 lb (126.554 kg)  SpO2 93%  BP Readings from Last 3 Encounters:  12/21/14 130/72  09/16/14 148/64  06/15/14 140/62    Wt Readings from Last 3 Encounters:  12/21/14 279 lb (126.554 kg)  09/16/14 283 lb (128.368 kg)  06/15/14 269 lb (122.018 kg)    Physical Exam  Constitutional: She appears well-developed. No distress.  Obese  HENT:  Head: Normocephalic.  Right Ear: External ear normal.  Left Ear: External ear normal.  Nose: Nose normal.  Mouth/Throat: Oropharynx is clear and moist.  Eyes: Conjunctivae are normal. Pupils are equal, round, and reactive to light. Right eye exhibits no discharge. Left eye exhibits no discharge.  Neck: Normal range of motion. Neck supple. No JVD present. No tracheal deviation present. No thyromegaly present.  Cardiovascular: Normal rate, regular rhythm and normal heart sounds.   Pulmonary/Chest: No stridor. No respiratory distress. She has no wheezes.  Abdominal: Soft. Bowel sounds are normal.  She exhibits no distension and no mass. There is no tenderness. There is no rebound and no guarding.  Musculoskeletal: She exhibits edema and tenderness.  Lymphadenopathy:    She has no cervical adenopathy.  Neurological: She displays normal reflexes. No cranial nerve deficit. She exhibits normal muscle tone. Coordination normal.  Skin: Rash noted. No  erythema.  Psychiatric: Her behavior is normal. Judgment and thought content normal.  Psoriatic rash Sad  Lab Results  Component Value Date   WBC 2.4 Repeated and verified X2.* 12/15/2014   HGB 11.9* 12/15/2014   HCT 34.8* 12/15/2014   PLT 56.0* 12/15/2014   GLUCOSE 87 12/15/2014   CHOL 164 06/02/2010   TRIG 97.0 06/02/2010   HDL 43.50 06/02/2010   LDLCALC 101* 06/02/2010   ALT 22 12/15/2014   AST 38* 12/15/2014   NA 141 12/15/2014   K 3.8 12/15/2014   CL 107 12/15/2014   CREATININE 0.68 12/15/2014   BUN 10 12/15/2014   CO2 31 12/15/2014   TSH 1.96 09/16/2014   INR 1.5* 03/13/2014   HGBA1C 3.9* 12/15/2014    Dg Chest 2 View  06/11/2013   CLINICAL DATA:  Cough and congestion.  For proximally 1 month.  EXAM: CHEST  2 VIEW  COMPARISON:  PA and lateral chest x-ray dated September 07, 2009.  FINDINGS: The lungs are adequately inflated. There is an area of patchy increased density in the right lower lung on the frontal film approximately 3 cm above the hemidiaphragm. A tiny air bronchogram is suspected within it. On the lateral film this likely lies in the retrocardiac region. The left lung is clear The cardiac silhouette is normal in size. The pulmonary vascularity is not engorged. The mediastinum is normal in width. There is no pleural effusion or pneumothorax. The observed portions of the bony thorax exhibit no acute abnormalities.  IMPRESSION: Mildly increased density in the right lower lobe is suspicious for subsegmental atelectasis or developing pneumonia. Elsewhere the lung parenchyma it appears clear. There is no evidence of CHF. Followup films following therapy are recommended to assure clearing.   Electronically Signed   By: Lori  Mills   On: 06/11/2013 16:52    Assessment & Plan:   There are no diagnoses linked to this encounter. I am having Lori Mills maintain her Acidophilus, vitamin B-12, cholecalciferol, gabapentin, triamcinolone cream, temazepam, loratadine, furosemide,  HYDROcodone-acetaminophen, potassium chloride, losartan, and levothyroxine.  No orders of the defined types were placed in this encounter.     Follow-up: No Follow-up on file.  Sonda Primes, MD

## 2014-12-23 ENCOUNTER — Telehealth: Payer: Self-pay | Admitting: Hematology & Oncology

## 2014-12-23 NOTE — Telephone Encounter (Signed)
Lt mess regarding new pt appt.  °

## 2014-12-31 ENCOUNTER — Telehealth: Payer: Self-pay

## 2014-12-31 NOTE — Telephone Encounter (Signed)
Confirmed new pt appt with Lori Mills with Dr Myna Hidalgo on 01/29/15 starting at 10:00

## 2015-01-11 ENCOUNTER — Telehealth: Payer: Self-pay | Admitting: Hematology & Oncology

## 2015-01-11 NOTE — Telephone Encounter (Signed)
Lt mess regarding changing 8/26 to 9/8 at 3:15

## 2015-01-29 ENCOUNTER — Ambulatory Visit: Payer: Medicare PPO

## 2015-01-29 ENCOUNTER — Other Ambulatory Visit: Payer: Medicare PPO

## 2015-01-29 ENCOUNTER — Ambulatory Visit: Payer: Medicare PPO | Admitting: Hematology & Oncology

## 2015-02-04 ENCOUNTER — Other Ambulatory Visit: Payer: Self-pay | Admitting: Internal Medicine

## 2015-02-09 ENCOUNTER — Telehealth: Payer: Self-pay | Admitting: Internal Medicine

## 2015-02-09 MED ORDER — HYDROCODONE BITARTRATE ER 60 MG PO T24A: 60.0000 mg | EXTENDED_RELEASE_TABLET | Freq: Every day | ORAL | Status: DC

## 2015-02-09 NOTE — Telephone Encounter (Signed)
Patient states that she is experiencing a reaction to oxycodone RX in July. She states that it makes her break out on her face and itch. She requests to switch back to hydrocodone, because she feels like it helped her better. She states that she still has several of the August script and the September script to bring back in.

## 2015-02-09 NOTE — Telephone Encounter (Signed)
Left detailed mess informing pt of below.  New Rx for Hysingla ER is upfront for p/u.

## 2015-02-09 NOTE — Telephone Encounter (Signed)
OK - bring Rx and leftover pills. It will be Hysingla (hydrocodone w/o tylenol). RTC 1 mo Thx

## 2015-02-10 ENCOUNTER — Telehealth: Payer: Self-pay | Admitting: *Deleted

## 2015-02-10 MED ORDER — HYDROCODONE-ACETAMINOPHEN 10-325 MG PO TABS
1.0000 | ORAL_TABLET | Freq: Three times a day (TID) | ORAL | Status: DC | PRN
Start: 2015-02-10 — End: 2015-02-19

## 2015-02-10 NOTE — Telephone Encounter (Signed)
Pt informed- rx is upfront for p/u. PA initiated via phone. PA will be submitted for review.    Ref # [id number]

## 2015-02-10 NOTE — Telephone Encounter (Signed)
I called pt- She does not want to try dilaudid. She wants to go back to Hydrocodone. Possibly a stronger strength with less APAP? Please advise.

## 2015-02-10 NOTE — Telephone Encounter (Signed)
Pls start a PA on Hysingla or Hysingla ER. I'll give a Rx for Norco x 1 mo, however we need to eliminate acetaminophen in 1 mo due to pt's liver problem Thx

## 2015-02-10 NOTE — Telephone Encounter (Signed)
Rec fax stating [facility] will not cover Hysingla ER 60 mg. They ask for alternative  Hydrocodone??  OR do you think I can get it covered since patient had allergic reaction to Oxycodone?

## 2015-02-10 NOTE — Telephone Encounter (Signed)
pls ask Lori Mills if she would like to try dilaudid instead? Thx

## 2015-02-11 ENCOUNTER — Other Ambulatory Visit (HOSPITAL_BASED_OUTPATIENT_CLINIC_OR_DEPARTMENT_OTHER): Payer: Medicare PPO

## 2015-02-11 ENCOUNTER — Ambulatory Visit: Payer: Medicare PPO

## 2015-02-11 ENCOUNTER — Ambulatory Visit (HOSPITAL_BASED_OUTPATIENT_CLINIC_OR_DEPARTMENT_OTHER): Payer: Medicare PPO | Admitting: Hematology & Oncology

## 2015-02-11 ENCOUNTER — Encounter: Payer: Self-pay | Admitting: Hematology & Oncology

## 2015-02-11 VITALS — BP 136/53 | HR 85 | Temp 97.9°F | Resp 16 | Ht 63.0 in | Wt 278.0 lb

## 2015-02-11 DIAGNOSIS — K7581 Nonalcoholic steatohepatitis (NASH): Secondary | ICD-10-CM

## 2015-02-11 DIAGNOSIS — D72819 Decreased white blood cell count, unspecified: Secondary | ICD-10-CM | POA: Diagnosis not present

## 2015-02-11 DIAGNOSIS — D61818 Other pancytopenia: Secondary | ICD-10-CM | POA: Diagnosis not present

## 2015-02-11 DIAGNOSIS — R161 Splenomegaly, not elsewhere classified: Secondary | ICD-10-CM | POA: Diagnosis not present

## 2015-02-11 HISTORY — DX: Decreased white blood cell count, unspecified: D72.819

## 2015-02-11 HISTORY — DX: Splenomegaly, not elsewhere classified: R16.1

## 2015-02-11 LAB — CBC WITH DIFFERENTIAL (CANCER CENTER ONLY)
BASO#: 0 10*3/uL (ref 0.0–0.2)
BASO%: 0.3 % (ref 0.0–2.0)
EOS%: 3.7 % (ref 0.0–7.0)
Eosinophils Absolute: 0.1 10*3/uL (ref 0.0–0.5)
HEMATOCRIT: 35.4 % (ref 34.8–46.6)
HEMOGLOBIN: 11.6 g/dL (ref 11.6–15.9)
LYMPH#: 0.6 10*3/uL — AB (ref 0.9–3.3)
LYMPH%: 21 % (ref 14.0–48.0)
MCH: 32 pg (ref 26.0–34.0)
MCHC: 32.8 g/dL (ref 32.0–36.0)
MCV: 98 fL (ref 81–101)
MONO#: 0.2 10*3/uL (ref 0.1–0.9)
MONO%: 6 % (ref 0.0–13.0)
NEUT%: 69 % (ref 39.6–80.0)
NEUTROS ABS: 2.1 10*3/uL (ref 1.5–6.5)
Platelets: 54 10*3/uL — ABNORMAL LOW (ref 145–400)
RBC: 3.62 10*6/uL — AB (ref 3.70–5.32)
RDW: 14 % (ref 11.1–15.7)
WBC: 3 10*3/uL — AB (ref 3.9–10.0)

## 2015-02-11 LAB — CHCC SATELLITE - SMEAR

## 2015-02-11 NOTE — Progress Notes (Signed)
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 OTHER    New Patient  : My platelets are low.  HPI: Ms. Lori Mills is a very nice 64 year old white female. She has a history of NASH. She has history of psoriasis and psoriatic arthritis. She has been on numerous medications for the psoriasis.  She asked he was seen by Dr. Lamonte Sakai several years ago because of thrombocytopenia. She did have a bone marrow biopsy done. This was done in June 2011. The pathology report ([id number]) showed a hypercellular marrow. She had abundant megakaryocytes. I would have to think that the cytogenetics were okay. It was elected just to follow her along as she was asymptomatic.  Her platelet count has been a little below her. She's had no bleeding. She's had no bruising.  It was felt that she needed to add to see hematology. Since Dr. Lamonte Sakai has left the practice, she was calmly referred to the [facility] for an evaluation.  She still has bad psoriasis. She put some topical cream on this. Again, she's been through antibiotic therapy. She's been through methotrexate.  She does have a was psoriatic arthritis.  She is followed by Dr. Monica Martinez down at [facility] for her cirrhosis.  She is not had any scans of her abdomen for several years. She is not sure when her last mammogram was done.  She's not noted any palpable lymph glands. Patient has compression stockings on her legs. She's not noted any rashes outside of that associated with the arthritis.  Overall, her performance status is ECOG 1.  Her husband passed away about 6 weeks ago. He had kidney issues which he developed in [place].   Past Medical History  Diagnosis Date  . Psoriasis     Dr Ronnald Ramp  . Psoriatic arthritis     Dr Patrecia Pour  . Depression   . HTN (hypertension)   . LBP (low back pain)   . Osteoarthritis   . Osteopenia   . Obesity   . Allergic rhinitis   .  Decreased platelet count     ITP 2011 Dr Lamonte Sakai  . Hepatomegaly     [place]/2011 Dr Monica Martinez [facility]-had MRI  . Cirrhosis of liver   . Thrombocytopenia due to hypersplenism   . Splenomegaly 02/11/2015  . Leukopenia 02/11/2015  :  Past Surgical History  Procedure Laterality Date  . None to report    :   Current outpatient prescriptions:  .  Cholecalciferol (VITAMIN D3) 1000 UNITS tablet, Take 1,000 Units by mouth daily.  , Disp: , Rfl:  .  furosemide (LASIX) 40 MG tablet, take 1 tablet by mouth if needed (WATER PILL), Disp: 30 tablet, Rfl: 5 .  gabapentin (NEURONTIN) 100 MG capsule, take 1 capsule by mouth twice a day OR as directed, Disp: 60 capsule, Rfl: 5 .  HYDROcodone Bitartrate ER (HYSINGLA ER) 60 MG T24A, Take 60 mg by mouth daily., Disp: 30 each, Rfl: 0 .  HYDROcodone-acetaminophen (NORCO) 10-325 MG per tablet, Take 1 tablet by mouth every 8 (eight) hours as needed for severe pain., Disp: 120 tablet, Rfl: 0 .  Lactobacillus (ACIDOPHILUS) CAPS, Take by mouth as needed.  , Disp: , Rfl:  .  levothyroxine (SYNTHROID, LEVOTHROID) 50 MCG tablet, take 1 tablet by mouth once daily, Disp: 30 tablet, Rfl: 11 .  loratadine (CLARITIN) 10 MG tablet, Take 1 tablet (10 mg total) by mouth daily. For  allergies, Disp: 100 tablet, Rfl: 3 .  losartan (COZAAR) 100 MG tablet, take 1 tablet by mouth once daily, Disp: 30 tablet, Rfl: 11 .  potassium chloride (KLOR-CON 10) 10 MEQ tablet, Take 1 tablet (10 mEq total) by mouth daily., Disp: 30 tablet, Rfl: 5 .  temazepam (RESTORIL) 15 MG capsule, Take 1-2 capsules (15-30 mg total) by mouth at bedtime as needed for sleep. For Insomnia, Disp: 60 capsule, Rfl: 3 .  triamcinolone cream (KENALOG) 0.1 %, Apply topically 2 (two) times daily., Disp: 30 g, Rfl: 1 .  vitamin B-12 (CYANOCOBALAMIN) 1000 MCG tablet, Take 1,000 mcg by mouth daily.  , Disp: , Rfl: :  :  Allergies  Allergen Reactions  . Cephalexin Itching  . Doxycycline Photosensitivity  . Enalapril  Maleate   . Hydrochlorothiazide W-Triamterene   . Ibuprofen     REACTION: pain in abd  . Oxycodone     rash  . Penicillins   . Spironolactone   . Bactrim [Sulfamethoxazole-Trimethoprim] Rash  :  Family History  Problem Relation Age of Onset  . Arthritis    . Hypertension    . Hypertension Mother   . Vasculitis Mother     temporal arteritis  :  Social History   Social History  . Marital Status: Married    Spouse Name: N/A  . Number of Children: N/A  . Years of Education: N/A   Occupational History  . Office [employer]   Social History Main Topics  . Smoking status: Never Smoker   . Smokeless tobacco: Not on file  . Alcohol Use: No  . Drug Use: No  . Sexual Activity: Not on file   Other Topics Concern  . Not on file   Social History Narrative   Regular Exercise-No        :  Pertinent items are noted in HPI.  Exam: _0 @  obese white female in no obvious distress. Vital signs showed a temperature of 97.9. Pulse 85. Blood pressure 136/53. Weight 276 pounds. Head and neck exam shows no ocular or oral lesions. She has no scleral icterus. She has no conjunctival pallor. There is no adenopathy in the neck. Lungs are clear bilaterally. She masses slight decrease at the bases. Cardiac exam regular rate and rhythm with no murmurs, rubs or bruits. Abdomen is obese. It is soft. There is no fluid wave. There is no guarding or rebound tenderness. Back exam shows no tenderness over the spine ribs or hips. Externa shows the compression stockings on her legs. She has some chronic stasis dermatitis changes in her legs. Skin exam shows the psoriatic plaques throughout her skin. Neurological exam is nonfocal.    Recent Labs  02/11/15 1453  WBC 3.0*  HGB 11.6  HCT 35.4  PLT 54*   No results for input(s): NA, K, CL, CO2, GLUCOSE, BUN, CREATININE, CALCIUM in the last 72 hours.  Blood smear review: Normochromic and normocytic population of red blood cells. She  has no nucleated red blood cells. There are no teardrop cells. I see no target cells. She has no schistocytes or spherocytes. White blood cells are slightly decreased in number. She has no immature myeloid cells. She has no hypersegmented polys. She has no atypical lymphocytes. Platelets are somewhat decreased in number. She has several large platelets. Platelets are well granulated.  Pathology: None     Assessment and Plan:  Ms. Lori Mills is a 64 year old white female. She has leukopenia and thrombocytopenia. I think her main issues  is the fact that she has the cirrhosis. She has NASH. With this comes splenomegaly.  I think it be nice to evaluate her liver and spleen with an ultrasound. We can see how big her spleen is. Because of her body habitus, I really cannot palpate her spleen.  I reassured her that she does not have anything worse then splenic sequestration of her platelets and white cells. I don't see anything that looks like myelodysplasia. I'll do not see anything that looks like malignancy.  Her sister was with her. Her sister, actually, was one of our lab techs at the main [facility]. She just retired.  I want to see her back in another 6 weeks. Para she does need to have a mammogram. I will set this up for her.  I spent about 45 minutes with she and her sister.

## 2015-02-11 NOTE — Telephone Encounter (Signed)
[employer] states patient must try/fail at least 2 long acting opioids (ie.ER Morphine, fentanyl patch or methadone), one of which must be an extended release morphine preparation. Please advise.

## 2015-02-11 NOTE — Telephone Encounter (Signed)
PA denied.

## 2015-02-11 NOTE — Telephone Encounter (Signed)
No, they will only cover ER Morphine, fentanyl patch or methadone.

## 2015-02-11 NOTE — Telephone Encounter (Signed)
Do they cover Hysingla dimidiate release? Thx

## 2015-02-12 LAB — IRON AND TIBC CHCC
%SAT: 70 % — ABNORMAL HIGH (ref 21–57)
IRON: 176 ug/dL — AB (ref 41–142)
TIBC: 251 ug/dL (ref 236–444)
UIBC: 75 ug/dL — ABNORMAL LOW (ref 120–384)

## 2015-02-12 LAB — FERRITIN CHCC: FERRITIN: 77 ng/mL (ref 9–269)

## 2015-02-12 NOTE — Telephone Encounter (Signed)
Is reg morphine covered? Thx

## 2015-02-15 LAB — COMPREHENSIVE METABOLIC PANEL
ALT: 20 U/L (ref 6–29)
AST: 35 U/L (ref 10–35)
Albumin: 2.9 g/dL — ABNORMAL LOW (ref 3.6–5.1)
Alkaline Phosphatase: 131 U/L — ABNORMAL HIGH (ref 33–130)
BILIRUBIN TOTAL: 4.1 mg/dL — AB (ref 0.2–1.2)
BUN: 9 mg/dL (ref 7–25)
CHLORIDE: 105 mmol/L (ref 98–110)
CO2: 27 mmol/L (ref 20–31)
CREATININE: 0.69 mg/dL (ref 0.50–0.99)
Calcium: 8.4 mg/dL — ABNORMAL LOW (ref 8.6–10.4)
GLUCOSE: 123 mg/dL — AB (ref 65–99)
Potassium: 3.6 mmol/L (ref 3.5–5.3)
SODIUM: 138 mmol/L (ref 135–146)
Total Protein: 6.2 g/dL (ref 6.1–8.1)

## 2015-02-15 LAB — RETICULOCYTES (CHCC)
ABS RETIC: 63.2 10*3/uL (ref 19.0–186.0)
RBC.: 3.72 MIL/uL — AB (ref 3.87–5.11)
RETIC CT PCT: 1.7 % (ref 0.4–2.3)

## 2015-02-15 LAB — ERYTHROPOIETIN: Erythropoietin: 41.3 m[IU]/mL — ABNORMAL HIGH (ref 2.6–18.5)

## 2015-02-19 NOTE — Telephone Encounter (Signed)
OK MS RX as pt's next rx - see meds Thx

## 2015-02-19 NOTE — Telephone Encounter (Signed)
I called [employer] and spoke to [place]. She states regular morphine is covered. She ran a few different forms and all appear to be tier 1. Please advise.

## 2015-02-23 ENCOUNTER — Encounter: Payer: Self-pay | Admitting: Internal Medicine

## 2015-02-23 ENCOUNTER — Ambulatory Visit (INDEPENDENT_AMBULATORY_CARE_PROVIDER_SITE_OTHER): Payer: Medicare PPO | Admitting: Internal Medicine

## 2015-02-23 VITALS — BP 138/62 | HR 73 | Wt 283.0 lb

## 2015-02-23 DIAGNOSIS — K7469 Other cirrhosis of liver: Secondary | ICD-10-CM

## 2015-02-23 DIAGNOSIS — D693 Immune thrombocytopenic purpura: Secondary | ICD-10-CM | POA: Diagnosis not present

## 2015-02-23 DIAGNOSIS — Z23 Encounter for immunization: Secondary | ICD-10-CM | POA: Diagnosis not present

## 2015-02-23 DIAGNOSIS — F32A Depression, unspecified: Secondary | ICD-10-CM

## 2015-02-23 DIAGNOSIS — M159 Polyosteoarthritis, unspecified: Secondary | ICD-10-CM

## 2015-02-23 DIAGNOSIS — F4321 Adjustment disorder with depressed mood: Secondary | ICD-10-CM | POA: Diagnosis not present

## 2015-02-23 DIAGNOSIS — M5440 Lumbago with sciatica, unspecified side: Secondary | ICD-10-CM

## 2015-02-23 DIAGNOSIS — F329 Major depressive disorder, single episode, unspecified: Secondary | ICD-10-CM | POA: Diagnosis not present

## 2015-02-23 DIAGNOSIS — M150 Primary generalized (osteo)arthritis: Secondary | ICD-10-CM

## 2015-02-23 MED ORDER — MORPHINE SULFATE 15 MG PO TABS: 7.5000 mg | ORAL_TABLET | Freq: Four times a day (QID) | ORAL | Status: DC | PRN

## 2015-02-23 NOTE — Assessment & Plan Note (Signed)
Husband died on 01/05/15 - grieving Counseling suggested

## 2015-02-23 NOTE — Assessment & Plan Note (Signed)
D/c Norco, start MS

## 2015-02-23 NOTE — Progress Notes (Signed)
Pre visit review using our clinic review tool, if applicable. No additional management support is needed unless otherwise documented below in the visit note. 

## 2015-02-23 NOTE — Assessment & Plan Note (Signed)
F/u w/Dr Ennever 

## 2015-02-23 NOTE — Assessment & Plan Note (Signed)
Husband died on 01/05/15 - grieving Counseling suggested 

## 2015-02-23 NOTE — Assessment & Plan Note (Signed)
Tylenol removed from pain management Rx

## 2015-02-23 NOTE — Assessment & Plan Note (Signed)
Replace Norco for MS po

## 2015-02-23 NOTE — Telephone Encounter (Signed)
Pt informed at 02/23/15.

## 2015-02-23 NOTE — Telephone Encounter (Signed)
Pt was given MSIR 15 mg at [facility] today. See meds.

## 2015-02-23 NOTE — Progress Notes (Signed)
Subjective:  Patient ID: Lori Mills, female    DOB: 1951/03/19  Age: 64 y.o. MRN: [id number]  CC: No chief complaint on file.   HPI Lori Mills presents for psoriasis, arthritis, liver cirrhosis, ITP f/u. Husband died on January 17, 2015 - grieving  Outpatient Prescriptions Prior to Visit  Medication Sig Dispense Refill  . Cholecalciferol (VITAMIN D3) 1000 UNITS tablet Take 1,000 Units by mouth daily.      . furosemide (LASIX) 40 MG tablet take 1 tablet by mouth if needed (WATER PILL) 30 tablet 5  . Lactobacillus (ACIDOPHILUS) CAPS Take by mouth as needed.      Marland Kitchen levothyroxine (SYNTHROID, LEVOTHROID) 50 MCG tablet take 1 tablet by mouth once daily 30 tablet 11  . loratadine (CLARITIN) 10 MG tablet Take 1 tablet (10 mg total) by mouth daily. For allergies 100 tablet 3  . losartan (COZAAR) 100 MG tablet take 1 tablet by mouth once daily 30 tablet 11  . potassium chloride (KLOR-CON 10) 10 MEQ tablet Take 1 tablet (10 mEq total) by mouth daily. 30 tablet 5  . temazepam (RESTORIL) 15 MG capsule Take 1-2 capsules (15-30 mg total) by mouth at bedtime as needed for sleep. For Insomnia 60 capsule 3  . triamcinolone cream (KENALOG) 0.1 % Apply topically 2 (two) times daily. 30 g 1  . vitamin B-12 (CYANOCOBALAMIN) 1000 MCG tablet Take 1,000 mcg by mouth daily.      Marland Kitchen gabapentin (NEURONTIN) 100 MG capsule take 1 capsule by mouth twice a day OR as directed (Patient not taking: Reported on 02/23/2015) 60 capsule 5   No facility-administered medications prior to visit.    ROS Review of Systems  Constitutional: Positive for fatigue and unexpected weight change. Negative for chills, activity change and appetite change.  HENT: Negative for congestion, mouth sores and sinus pressure.   Eyes: Negative for visual disturbance.  Respiratory: Negative for cough and chest tightness.   Gastrointestinal: Negative for nausea and abdominal pain.  Genitourinary: Negative for frequency, difficulty urinating and  vaginal pain.  Musculoskeletal: Positive for back pain. Negative for gait problem.  Skin: Negative for pallor and rash.  Neurological: Negative for dizziness, tremors, weakness, numbness and headaches.  Psychiatric/Behavioral: Positive for decreased concentration. Negative for suicidal ideas, confusion and sleep disturbance. The patient is nervous/anxious.     Objective:  BP 138/62 mmHg  Pulse 73  Wt 283 lb (128.368 kg)  SpO2 98%  BP Readings from Last 3 Encounters:  02/23/15 138/62  02/11/15 136/53  12/21/14 130/72    Wt Readings from Last 3 Encounters:  02/23/15 283 lb (128.368 kg)  02/11/15 278 lb (126.1 kg)  12/21/14 279 lb (126.554 kg)    Physical Exam  Constitutional: She appears well-developed. No distress.  HENT:  Head: Normocephalic.  Right Ear: External ear normal.  Left Ear: External ear normal.  Nose: Nose normal.  Mouth/Throat: Oropharynx is clear and moist.  Eyes: Conjunctivae are normal. Pupils are equal, round, and reactive to light. Right eye exhibits no discharge. Left eye exhibits no discharge.  Neck: Normal range of motion. Neck supple. No JVD present. No tracheal deviation present. No thyromegaly present.  Cardiovascular: Normal rate, regular rhythm and normal heart sounds.   Pulmonary/Chest: No stridor. No respiratory distress. She has no wheezes.  Abdominal: Soft. Bowel sounds are normal. She exhibits no distension and no mass. There is no tenderness. There is no rebound and no guarding.  Musculoskeletal: She exhibits tenderness. She exhibits no edema.  Lymphadenopathy:  She has no cervical adenopathy.  Neurological: She displays normal reflexes. No cranial nerve deficit. She exhibits normal muscle tone. Coordination normal.  Skin: Rash noted. There is erythema.  Psychiatric: She has a normal mood and affect. Her behavior is normal. Judgment and thought content normal.  Obese LS tender  Lab Results  Component Value Date   WBC 3.0* 02/11/2015     HGB 11.6 02/11/2015   HCT 35.4 02/11/2015   PLT 54* 02/11/2015   GLUCOSE 123* 02/11/2015   CHOL 164 06/02/2010   TRIG 97.0 06/02/2010   HDL 43.50 06/02/2010   LDLCALC 101* 06/02/2010   ALT 20 02/11/2015   AST 35 02/11/2015   NA 138 02/11/2015   K 3.6 02/11/2015   CL 105 02/11/2015   CREATININE 0.69 02/11/2015   BUN 9 02/11/2015   CO2 27 02/11/2015   TSH 1.96 09/16/2014   INR 1.5* 03/13/2014   HGBA1C 3.9* 12/15/2014    Dg Chest 2 View  06/11/2013   CLINICAL DATA:  Cough and congestion.  For proximally 1 month.  EXAM: CHEST  2 VIEW  COMPARISON:  PA and lateral chest x-ray dated September 07, 2009.  FINDINGS: The lungs are adequately inflated. There is an area of patchy increased density in the right lower lung on the frontal film approximately 3 cm above the hemidiaphragm. A tiny air bronchogram is suspected within it. On the lateral film this likely lies in the retrocardiac region. The left lung is clear The cardiac silhouette is normal in size. The pulmonary vascularity is not engorged. The mediastinum is normal in width. There is no pleural effusion or pneumothorax. The observed portions of the bony thorax exhibit no acute abnormalities.  IMPRESSION: Mildly increased density in the right lower lobe is suspicious for subsegmental atelectasis or developing pneumonia. Elsewhere the lung parenchyma it appears clear. There is no evidence of CHF. Followup films following therapy are recommended to assure clearing.   Electronically Signed   By: David  Mills   On: 06/11/2013 16:52    Assessment & Plan:   Diagnoses and all orders for this visit:  Grief  Depression  Other cirrhosis of liver  Immune thrombocytopenic purpura  Primary osteoarthritis involving multiple joints  Midline low back pain with sciatica, sciatica laterality unspecified  Other orders -     morphine (MSIR) 15 MG tablet; Take 0.5-1 tablets (7.5-15 mg total) by mouth every 6 (six) hours as needed for severe pain.  I  have discontinued [name] HYDROcodone-acetaminophen. I am also having her start on morphine. Additionally, I am having her maintain her Acidophilus, vitamin B-12, cholecalciferol, gabapentin, triamcinolone cream, temazepam, loratadine, furosemide, potassium chloride, losartan, and levothyroxine.  Meds ordered this encounter  Medications  . DISCONTD: HYDROcodone-acetaminophen (NORCO) 10-325 MG per tablet    Sig: take 1 tablet by mouth every 8 hours if needed pain    Refill:  0  . morphine (MSIR) 15 MG tablet    Sig: Take 0.5-1 tablets (7.5-15 mg total) by mouth every 6 (six) hours as needed for severe pain.    Dispense:  120 tablet    Refill:  0    Please fill on or after 03/05/15     Follow-up: No Follow-up on file.  Sonda Primes, MD

## 2015-02-26 ENCOUNTER — Ambulatory Visit (HOSPITAL_BASED_OUTPATIENT_CLINIC_OR_DEPARTMENT_OTHER)
Admission: RE | Admit: 2015-02-26 | Discharge: 2015-02-26 | Disposition: A | Discharge status: Home / Self Care | Payer: Medicare PPO | Source: Ambulatory Visit | Attending: Hematology & Oncology | Admitting: Hematology & Oncology

## 2015-02-26 DIAGNOSIS — R161 Splenomegaly, not elsewhere classified: Secondary | ICD-10-CM | POA: Diagnosis present

## 2015-02-26 DIAGNOSIS — D72819 Decreased white blood cell count, unspecified: Secondary | ICD-10-CM

## 2015-02-26 DIAGNOSIS — Z1231 Encounter for screening mammogram for malignant neoplasm of breast: Secondary | ICD-10-CM | POA: Diagnosis present

## 2015-02-26 DIAGNOSIS — K7460 Unspecified cirrhosis of liver: Secondary | ICD-10-CM | POA: Diagnosis not present

## 2015-02-26 DIAGNOSIS — D61818 Other pancytopenia: Secondary | ICD-10-CM | POA: Insufficient documentation

## 2015-02-26 DIAGNOSIS — K7581 Nonalcoholic steatohepatitis (NASH): Secondary | ICD-10-CM

## 2015-03-02 ENCOUNTER — Telehealth: Payer: Self-pay | Admitting: *Deleted

## 2015-03-02 NOTE — Telephone Encounter (Addendum)
Patient aware of results. She will follow up with her liver specialist.   ----- Message from Josph Macho, MD sent at 03/02/2015  7:35 AM EDT ----- Call - spleen looks larger due to worsening cirrhosis.  Lori Mills

## 2015-03-03 ENCOUNTER — Telehealth: Payer: Self-pay | Admitting: Internal Medicine

## 2015-03-03 DIAGNOSIS — K7469 Other cirrhosis of liver: Secondary | ICD-10-CM

## 2015-03-03 NOTE — Telephone Encounter (Signed)
There is no  hepatitis specialist in [place]. There is a viral hepatitis clinic.  It is best to continue see pt's gastroenterologist at [facility] however. Thx

## 2015-03-03 NOTE — Telephone Encounter (Signed)
Patient is needing a referral to a liver specialist in [place]. Patient can be reached at [phone]

## 2015-03-04 NOTE — Telephone Encounter (Signed)
Left detailed mess informing pt of below.  

## 2015-03-04 NOTE — Telephone Encounter (Signed)
Pt informed - she wants to know if there is anyone in [place] or [place]. She states her GI doc in [facility] advised her there was nothing he could do for her as she is not a candidate for liver transplant. Please advise.

## 2015-03-04 NOTE — Telephone Encounter (Signed)
OK. Will ref to Dr Joellyn Rued or Dr Merri Brunette in [place] Thx

## 2015-03-19 ENCOUNTER — Telehealth: Payer: Self-pay | Admitting: Hematology & Oncology

## 2015-03-19 NOTE — Telephone Encounter (Signed)
Patient called and cx 03/22/15 and resch for 05/05/15

## 2015-03-22 ENCOUNTER — Ambulatory Visit: Payer: Medicare PPO | Admitting: Hematology & Oncology

## 2015-03-22 ENCOUNTER — Other Ambulatory Visit: Payer: Medicare PPO

## 2015-03-28 ENCOUNTER — Emergency Department (HOSPITAL_BASED_OUTPATIENT_CLINIC_OR_DEPARTMENT_OTHER)
Admission: EM | Admit: 2015-03-28 | Discharge: 2015-03-28 | Disposition: A | Discharge status: Home / Self Care | Payer: Medicare PPO | Attending: Emergency Medicine | Admitting: Emergency Medicine

## 2015-03-28 ENCOUNTER — Emergency Department (HOSPITAL_BASED_OUTPATIENT_CLINIC_OR_DEPARTMENT_OTHER): Payer: Medicare PPO

## 2015-03-28 ENCOUNTER — Encounter (HOSPITAL_BASED_OUTPATIENT_CLINIC_OR_DEPARTMENT_OTHER): Payer: Self-pay | Admitting: Emergency Medicine

## 2015-03-28 DIAGNOSIS — R600 Localized edema: Secondary | ICD-10-CM | POA: Insufficient documentation

## 2015-03-28 DIAGNOSIS — M8580 Other specified disorders of bone density and structure, unspecified site: Secondary | ICD-10-CM | POA: Diagnosis not present

## 2015-03-28 DIAGNOSIS — Z79899 Other long term (current) drug therapy: Secondary | ICD-10-CM | POA: Insufficient documentation

## 2015-03-28 DIAGNOSIS — E669 Obesity, unspecified: Secondary | ICD-10-CM | POA: Insufficient documentation

## 2015-03-28 DIAGNOSIS — Z862 Personal history of diseases of the blood and blood-forming organs and certain disorders involving the immune mechanism: Secondary | ICD-10-CM | POA: Diagnosis not present

## 2015-03-28 DIAGNOSIS — Z872 Personal history of diseases of the skin and subcutaneous tissue: Secondary | ICD-10-CM | POA: Diagnosis not present

## 2015-03-28 DIAGNOSIS — M1990 Unspecified osteoarthritis, unspecified site: Secondary | ICD-10-CM | POA: Diagnosis not present

## 2015-03-28 DIAGNOSIS — Z8719 Personal history of other diseases of the digestive system: Secondary | ICD-10-CM | POA: Insufficient documentation

## 2015-03-28 DIAGNOSIS — Z7952 Long term (current) use of systemic steroids: Secondary | ICD-10-CM | POA: Insufficient documentation

## 2015-03-28 DIAGNOSIS — J40 Bronchitis, not specified as acute or chronic: Secondary | ICD-10-CM

## 2015-03-28 DIAGNOSIS — I10 Essential (primary) hypertension: Secondary | ICD-10-CM | POA: Insufficient documentation

## 2015-03-28 DIAGNOSIS — Z880 Allergy status to penicillin: Secondary | ICD-10-CM | POA: Diagnosis not present

## 2015-03-28 DIAGNOSIS — R05 Cough: Secondary | ICD-10-CM | POA: Diagnosis present

## 2015-03-28 LAB — COMPREHENSIVE METABOLIC PANEL WITH GFR
ALT: 20 U/L (ref 14–54)
AST: 34 U/L (ref 15–41)
Albumin: 2.8 g/dL — ABNORMAL LOW (ref 3.5–5.0)
Alkaline Phosphatase: 132 U/L — ABNORMAL HIGH (ref 38–126)
Anion gap: 3 — ABNORMAL LOW (ref 5–15)
BUN: 11 mg/dL (ref 6–20)
CO2: 28 mmol/L (ref 22–32)
Calcium: 8.5 mg/dL — ABNORMAL LOW (ref 8.9–10.3)
Chloride: 107 mmol/L (ref 101–111)
Creatinine, Ser: 0.6 mg/dL (ref 0.44–1.00)
GFR calc Af Amer: >60 mL/min
GFR calc non Af Amer: >60 mL/min
Glucose, Bld: 87 mg/dL (ref 65–99)
Potassium: 3.8 mmol/L (ref 3.5–5.1)
Sodium: 138 mmol/L (ref 135–145)
Total Bilirubin: 4.3 mg/dL — ABNORMAL HIGH (ref 0.3–1.2)
Total Protein: 6.8 g/dL (ref 6.5–8.1)

## 2015-03-28 LAB — CBC WITH DIFFERENTIAL/PLATELET
Basophils Absolute: 0 K/uL (ref 0.0–0.1)
Basophils Relative: 1 %
Eosinophils Absolute: 0.1 K/uL (ref 0.0–0.7)
Eosinophils Relative: 4 %
HCT: 35.6 % — ABNORMAL LOW (ref 36.0–46.0)
Hemoglobin: 11.4 g/dL — ABNORMAL LOW (ref 12.0–15.0)
Lymphocytes Relative: 17 %
Lymphs Abs: 0.6 K/uL — ABNORMAL LOW (ref 0.7–4.0)
MCH: 30.9 pg (ref 26.0–34.0)
MCHC: 32 g/dL (ref 30.0–36.0)
MCV: 96.5 fL (ref 78.0–100.0)
Monocytes Absolute: 0.3 K/uL (ref 0.1–1.0)
Monocytes Relative: 10 %
Neutro Abs: 2.3 K/uL (ref 1.7–7.7)
Neutrophils Relative %: 68 %
Platelets: 54 K/uL — ABNORMAL LOW (ref 150–400)
RBC: 3.69 MIL/uL — ABNORMAL LOW (ref 3.87–5.11)
RDW: 14.8 % (ref 11.5–15.5)
WBC: 3.3 K/uL — ABNORMAL LOW (ref 4.0–10.5)

## 2015-03-28 MED ORDER — IPRATROPIUM-ALBUTEROL 0.5-2.5 (3) MG/3ML IN SOLN
3.0000 mL | Freq: Four times a day (QID) | RESPIRATORY_TRACT | Status: DC
  Administered 2015-03-28: 3 mL via RESPIRATORY_TRACT
  Filled 2015-03-28: qty 3

## 2015-03-28 MED ORDER — IPRATROPIUM-ALBUTEROL 0.5-2.5 (3) MG/3ML IN SOLN: 3.0000 mL | Freq: Four times a day (QID) | RESPIRATORY_TRACT | Status: DC

## 2015-03-28 MED ORDER — AMOXICILLIN-POT CLAVULANATE 875-125 MG PO TABS: 1.0000 | ORAL_TABLET | Freq: Two times a day (BID) | ORAL | Status: DC

## 2015-03-28 MED ORDER — ALBUTEROL SULFATE HFA 108 (90 BASE) MCG/ACT IN AERS: 1.0000 | INHALATION_SPRAY | Freq: Four times a day (QID) | RESPIRATORY_TRACT | Status: AC | PRN

## 2015-03-28 NOTE — ED Notes (Signed)
Patient transported to Ultrasound 

## 2015-03-28 NOTE — ED Notes (Signed)
Pt admitted to not taking medications as prescribed, she has recently lossed her husband and is not dealing well with it. Has not taken furosemide in over a week because of leg cramps she gets after taking medications. Pt is forgetful and is having difficulty remembering when to take medications. She reports that she does live alone but has a daughter < 2 miles away

## 2015-03-28 NOTE — ED Provider Notes (Signed)
CSN: [id number]     Arrival date & time 03/28/15  1153 History   First MD Initiated Contact with Patient 03/28/15 1246     Chief Complaint  Patient presents with  . Cough  . Leg Swelling     (Consider location/radiation/quality/duration/timing/severity/associated sxs/prior Treatment) HPI   Lori Mills is a 64 year old female with a past medical history of severe psoriasis, stage IV cirrhosis, ITP, leukopenia who presents the emergency part today complaining of cough and right lower extremity pain. Patient states that approximately 3 days ago she started coughing which has progressively gotten worse. Cough is nonproductive, however feels very deep within her lungs. Denies fever, chest pain, difficulty breathing, headache, blurry vision, rhinorrhea, sore throat. Patient also complaining of right lower extremity redness and pain. Patient has severe psoriasis on her legs which causes discoloration of the skin. However she has noticed her right leg has become more red and warm to the touch over the last 5 days. Now is causing her pain. Denies decreased sensation, fever. Patient states she was treated earlier this week with Macrobid for a bladder infection for which she is still taking antibiotics for.  Past Medical History  Diagnosis Date  . Psoriasis     Dr Yetta Barre  . Psoriatic arthritis (HCC)     Dr Titus Dubin  . Depression   . HTN (hypertension)   . LBP (low back pain)   . Osteoarthritis   . Osteopenia   . Obesity   . Allergic rhinitis   . 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 Hepatomegaly     [place]/2011 Dr Julieta Gutting [facility]-had MRI  . Cirrhosis of liver (HCC)   . Thrombocytopenia due to hypersplenism   . Splenomegaly 02/11/2015  . Leukopenia 02/11/2015   Past Surgical History  Procedure Laterality Date  . None to report     Family History  Problem Relation Age of Onset  . Arthritis    . Hypertension    . Hypertension Mother   . Vasculitis Mother    temporal arteritis   Social History  Substance Use Topics  . Smoking status: Never Smoker   . Smokeless tobacco: None  . Alcohol Use: No   OB History    No data available     Review of Systems  All other systems reviewed and are negative.     Allergies  Cephalexin; Doxycycline; Enalapril maleate; Hydrochlorothiazide w-triamterene; Ibuprofen; Oxycodone; Penicillins; Spironolactone; and Bactrim  Home Medications   Prior to Admission medications   Medication Sig Start Date End Date Taking? Authorizing Provider  furosemide (LASIX) 40 MG tablet take 1 tablet by mouth if needed (WATER PILL) 09/16/14  Yes Jacinta Shoe V, MD  levothyroxine (SYNTHROID, LEVOTHROID) 50 MCG tablet take 1 tablet by mouth once daily 11/18/14  Yes Aleksei Plotnikov V, MD  loratadine (CLARITIN) 10 MG tablet Take 1 tablet (10 mg total) by mouth daily. For allergies 07/30/14  Yes Aleksei Plotnikov V, MD  losartan (COZAAR) 100 MG tablet take 1 tablet by mouth once daily 11/18/14  Yes Aleksei Plotnikov V, MD  morphine (MSIR) 15 MG tablet Take 0.5-1 tablets (7.5-15 mg total) by mouth every 6 (six) hours as needed for severe pain. 02/23/15  Yes Aleksei Plotnikov V, MD  potassium chloride (KLOR-CON 10) 10 MEQ tablet Take 1 tablet (10 mEq total) by mouth daily. 09/16/14  Yes Aleksei Plotnikov V, MD  triamcinolone cream (KENALOG) 0.1 % Apply topically 2 (two) times daily. 10/08/13  Yes Jacinta Shoe V, MD  Cholecalciferol (VITAMIN D3) 1000 UNITS tablet Take 1,000 Units by mouth daily.      Historical Provider, MD  Lactobacillus (ACIDOPHILUS) CAPS Take by mouth as needed.      Historical Provider, MD  vitamin B-12 (CYANOCOBALAMIN) 1000 MCG tablet Take 1,000 mcg by mouth daily.      Historical Provider, MD   BP 131/55 mmHg  Pulse 86  Temp(Src) 98.3 F (36.8 C) (Oral)  Resp 18  Ht 5\' 3"  (1.6 m)  Wt 275 lb (124.739 kg)  BMI 48.73 kg/m2  SpO2 95% Physical Exam  Constitutional: She is oriented to person, place, and  time. She appears well-developed and well-nourished. No distress.  HENT:  Head: Normocephalic and atraumatic.  Mouth/Throat: Oropharynx is clear and moist. No oropharyngeal exudate.  Eyes: Conjunctivae and EOM are normal. Pupils are equal, round, and reactive to light. Right eye exhibits no discharge. Left eye exhibits no discharge. No scleral icterus.  Neck: Normal range of motion. No JVD present.  Cardiovascular: Normal rate, regular rhythm, normal heart sounds and intact distal pulses.  Exam reveals no gallop and no friction rub.   No murmur heard. Pulmonary/Chest: Effort normal. No respiratory distress. She has wheezes. She has no rales. She exhibits no tenderness.  Expiratory wheezes in bilateral lung bases.   Abdominal: Soft. She exhibits no distension and no mass. There is no tenderness. There is no rebound and no guarding.  Musculoskeletal: Normal range of motion. She exhibits edema.  Bilateral lower extremity edema. R>L. Non pitting. Intact distal pulses. Extensive psoriasis present. RLE erythematous, likely due to psoriasis but also warm to the touch.   Lymphadenopathy:    She has no cervical adenopathy.  Neurological: She is alert and oriented to person, place, and time.  Skin: Skin is warm and dry. No rash noted. She is not diaphoretic. No erythema. No pallor.  Extensive psoriasis along lower and upper extremities  Psychiatric: She has a normal mood and affect. Her behavior is normal.  Nursing note and vitals reviewed.   ED Course  Procedures (including critical care time) Labs Review Labs Reviewed  COMPREHENSIVE METABOLIC PANEL - Abnormal; Notable for the following:    Calcium 8.5 (*)    Albumin 2.8 (*)    Alkaline Phosphatase 132 (*)    Total Bilirubin 4.3 (*)    Anion gap 3 (*)    All other components within normal limits  CBC WITH DIFFERENTIAL/PLATELET - Abnormal; Notable for the following:    WBC 3.3 (*)    RBC 3.69 (*)    Hemoglobin 11.4 (*)    HCT 35.6 (*)     Platelets 54 (*)    Lymphs Abs 0.6 (*)    All other components within normal limits    Imaging Review Dg Chest 2 View  03/28/2015  CLINICAL DATA:  Cough with some mucus production last night, headache for 3-4 days, leg swelling, history pneumonia, asthma, hypertension, obesity, cirrhosis with splenomegaly EXAM: CHEST  2 VIEW COMPARISON:  06/11/2013 FINDINGS: Enlargement of cardiac silhouette with pulmonary vascular congestion. Minimal central peribronchial thickening, chronic. Linear subsegmental atelectasis versus scarring LEFT base. No definite acute infiltrate, pleural effusion or pneumothorax. Osseous structures unremarkable. IMPRESSION: Enlargement of cardiac silhouette with pulmonary vascular congestion. Minimal bronchitic changes with LEFT basilar atelectasis versus scarring. No definite acute infiltrate. Electronically Signed   By: 08/09/2013 M.D.   On: 03/28/2015 13:25   I have personally reviewed and evaluated these images and lab results as  part of my medical decision-making.   EKG Interpretation None      MDM   Final diagnoses:  Bronchitis    64 y.o F with hx of stage 4 cirrhosis, severe psoriasis, and ITP presents with new onset cough x3days and RLE swelling. No fever, chest pain. Wheezes heard in bilateral lung bases. Given duoneb and reports symptomatic improvement from this. CXR reveals pulmonary vascular congestion. No focal infiltrate or consolidation. Pt admits that she has been non compliant with her lasix. Suspect this is causing the increased pulmonary vascular congestion as well as her increased LE edema. Discussed with pt importance of compliance with lasix. Pt expresses understanding. However, suspect there is an element of bronchitis due to cough.   LE edema: R>L. Suspect this is due to noncompliance with lasix. However, will doppler. Reveals NO DVT. RLE is also erythematous and warm to the touch. Possible element of cellulitis. Will treat with Augmentin for dual  coverage of lung bacteria. Will also give albuterol for SOB. Pt will follow up with PCP in 48 hours. Return precautions outlined in patient discharge instructions. VSS.   Patient was discussed with and seen by Dr. Donnald Garre who agrees with the treatment plan.      Lester Kinsman [place], PA-C 03/30/15 1046  Arby Barrette, MD 04/05/15 1010

## 2015-03-28 NOTE — ED Notes (Signed)
Pt reports that she started coughing Thursday after completing dose of bactrim, non productive,

## 2015-03-28 NOTE — ED Notes (Signed)
Admit disposition and charting done on wrong patient.

## 2015-03-28 NOTE — Discharge Instructions (Signed)
Upper Respiratory Infection, Adult Most upper respiratory infections (URIs) are caused by a virus. A URI affects the nose, throat, and upper air passages. The most common type of URI is often called "the common cold." HOME CARE   Take medicines only as told by your doctor.  Gargle warm saltwater or take cough drops to comfort your throat as told by your doctor.  Use a warm mist humidifier or inhale steam from a shower to increase air moisture. This may make it easier to breathe.  Drink enough fluid to keep your pee (urine) clear or pale yellow.  Eat soups and other clear broths.  Have a healthy diet.  Rest as needed.  Go back to work when your fever is gone or your doctor says it is okay.  You may need to stay home longer to avoid giving your URI to others.  You can also wear a face mask and wash your hands often to prevent spread of the virus.  Use your inhaler more if you have asthma.  Do not use any tobacco products, including cigarettes, chewing tobacco, or electronic cigarettes. If you need help quitting, ask your doctor. GET HELP IF:  You are getting worse, not better.  Your symptoms are not helped by medicine.  You have chills.  You are getting more short of breath.  You have brown or red mucus.  You have yellow or brown discharge from your nose.  You have pain in your face, especially when you bend forward.  You have a fever.  You have puffy (swollen) neck glands.  You have pain while swallowing.  You have white areas in the back of your throat. GET HELP RIGHT AWAY IF:   You have very bad or constant:  Headache.  Ear pain.  Pain in your forehead, behind your eyes, and over your cheekbones (sinus pain).  Chest pain.  You have long-lasting (chronic) lung disease and any of the following:  Wheezing.  Long-lasting cough.  Coughing up blood.  A change in your usual mucus.  You have a stiff neck.  You have changes in  your:  Vision.  Hearing.  Thinking.  Mood. MAKE SURE YOU:   Understand these instructions.  Will watch your condition.  Will get help right away if you are not doing well or get worse.   This information is not intended to replace advice given to you by your health care provider. Make sure you discuss any questions you have with your health care provider.   Follow up with your primary care provider in 48 hours for reevaluation. Encourage compliance with Lasix to reduce swelling in lower extremities. Take Atarax as prescribed. Use albuterol inhaler at home as needed for difficulty breathing. Return to the emergency department if you experience worsening of your symptoms fever, chest pain.

## 2015-04-06 ENCOUNTER — Encounter: Payer: Self-pay | Admitting: Internal Medicine

## 2015-04-06 ENCOUNTER — Ambulatory Visit (INDEPENDENT_AMBULATORY_CARE_PROVIDER_SITE_OTHER): Payer: Medicare PPO | Admitting: Internal Medicine

## 2015-04-06 VITALS — BP 120/54 | HR 83 | Temp 98.6°F | Wt 287.0 lb

## 2015-04-06 DIAGNOSIS — I10 Essential (primary) hypertension: Secondary | ICD-10-CM | POA: Diagnosis not present

## 2015-04-06 DIAGNOSIS — K7469 Other cirrhosis of liver: Secondary | ICD-10-CM | POA: Diagnosis not present

## 2015-04-06 DIAGNOSIS — J209 Acute bronchitis, unspecified: Secondary | ICD-10-CM | POA: Diagnosis not present

## 2015-04-06 MED ORDER — FUROSEMIDE 40 MG PO TABS: ORAL_TABLET | ORAL | Status: DC

## 2015-04-06 MED ORDER — FLUCONAZOLE 150 MG PO TABS: 150.0000 mg | ORAL_TABLET | Freq: Once | ORAL | Status: DC

## 2015-04-06 MED ORDER — ESCITALOPRAM OXALATE 5 MG PO TABS: 5.0000 mg | ORAL_TABLET | Freq: Every day | ORAL | Status: DC

## 2015-04-06 NOTE — Progress Notes (Signed)
Pre visit review using our clinic review tool, if applicable. No additional management support is needed unless otherwise documented below in the visit note. 

## 2015-04-06 NOTE — Assessment & Plan Note (Signed)
Losartan 

## 2015-04-06 NOTE — Assessment & Plan Note (Signed)
Finish Augmentin today Diflucan po

## 2015-04-06 NOTE — Assessment & Plan Note (Signed)
No change 

## 2015-04-06 NOTE — Progress Notes (Signed)
Subjective:  Patient ID: Lori Mills, female    DOB: Jan 20, 1951  Age: 64 y.o. MRN: [id number]  CC: No chief complaint on file.   HPI Lori Mills presents for UTI, bronchitis, HTN, LBP f/u.   Outpatient Prescriptions Prior to Visit  Medication Sig Dispense Refill  . albuterol (PROVENTIL HFA;VENTOLIN HFA) 108 (90 BASE) MCG/ACT inhaler Inhale 1-2 puffs into the lungs every 6 (six) hours as needed for wheezing or shortness of breath. 1 Inhaler 0  . amoxicillin-clavulanate (AUGMENTIN) 875-125 MG tablet Take 1 tablet by mouth every 12 (twelve) hours. 14 tablet 0  . Cholecalciferol (VITAMIN D3) 1000 UNITS tablet Take 1,000 Units by mouth daily.      . furosemide (LASIX) 40 MG tablet take 1 tablet by mouth if needed (WATER PILL) 30 tablet 5  . Lactobacillus (ACIDOPHILUS) CAPS Take by mouth as needed.      Marland Kitchen levothyroxine (SYNTHROID, LEVOTHROID) 50 MCG tablet take 1 tablet by mouth once daily 30 tablet 11  . loratadine (CLARITIN) 10 MG tablet Take 1 tablet (10 mg total) by mouth daily. For allergies 100 tablet 3  . losartan (COZAAR) 100 MG tablet take 1 tablet by mouth once daily 30 tablet 11  . morphine (MSIR) 15 MG tablet Take 0.5-1 tablets (7.5-15 mg total) by mouth every 6 (six) hours as needed for severe pain. 120 tablet 0  . potassium chloride (KLOR-CON 10) 10 MEQ tablet Take 1 tablet (10 mEq total) by mouth daily. 30 tablet 5  . triamcinolone cream (KENALOG) 0.1 % Apply topically 2 (two) times daily. 30 g 1  . vitamin B-12 (CYANOCOBALAMIN) 1000 MCG tablet Take 1,000 mcg by mouth daily.       No facility-administered medications prior to visit.    ROS Review of Systems  Constitutional: Positive for unexpected weight change. Negative for chills, activity change, appetite change and fatigue.  HENT: Negative for congestion, mouth sores and sinus pressure.   Eyes: Negative for visual disturbance.  Respiratory: Positive for cough. Negative for chest tightness.   Gastrointestinal:  Negative for nausea and abdominal pain.  Genitourinary: Negative for frequency, difficulty urinating and vaginal pain.  Musculoskeletal: Positive for back pain and arthralgias. Negative for gait problem.  Skin: Negative for pallor and rash.  Neurological: Negative for dizziness, tremors, weakness, numbness and headaches.  Psychiatric/Behavioral: Positive for sleep disturbance and decreased concentration. Negative for confusion. The patient is nervous/anxious.     Objective:  BP 120/54 mmHg  Pulse 83  Temp(Src) 98.6 F (37 C) (Oral)  Wt 287 lb (130.182 kg)  SpO2 97%  BP Readings from Last 3 Encounters:  04/06/15 120/54  03/28/15 118/41  02/23/15 138/62    Wt Readings from Last 3 Encounters:  04/06/15 287 lb (130.182 kg)  03/28/15 275 lb (124.739 kg)  02/23/15 283 lb (128.368 kg)    Physical Exam  Constitutional: She appears well-developed. No distress.  HENT:  Head: Normocephalic.  Right Ear: External ear normal.  Left Ear: External ear normal.  Nose: Nose normal.  Mouth/Throat: Oropharynx is clear and moist.  Eyes: Conjunctivae are normal. Pupils are equal, round, and reactive to light. Right eye exhibits no discharge. Left eye exhibits no discharge.  Neck: Normal range of motion. Neck supple. No JVD present. No tracheal deviation present. No thyromegaly present.  Cardiovascular: Normal rate, regular rhythm and normal heart sounds.   Pulmonary/Chest: No stridor. No respiratory distress. She has no wheezes.  Abdominal: Soft. Bowel sounds are normal. She exhibits no distension and  no mass. There is no tenderness. There is no rebound and no guarding.  Musculoskeletal: She exhibits edema. She exhibits no tenderness.  Lymphadenopathy:    She has no cervical adenopathy.  Neurological: She displays normal reflexes. No cranial nerve deficit. She exhibits normal muscle tone. Coordination normal.  Skin: Rash noted. No erythema.  Psychiatric: She has a normal mood and affect. Her  behavior is normal. Judgment and thought content normal.    Lab Results  Component Value Date   WBC 3.3* 03/28/2015   HGB 11.4* 03/28/2015   HCT 35.6* 03/28/2015   PLT 54* 03/28/2015   GLUCOSE 87 03/28/2015   CHOL 164 06/02/2010   TRIG 97.0 06/02/2010   HDL 43.50 06/02/2010   LDLCALC 101* 06/02/2010   ALT 20 03/28/2015   AST 34 03/28/2015   NA 138 03/28/2015   K 3.8 03/28/2015   CL 107 03/28/2015   CREATININE 0.60 03/28/2015   BUN 11 03/28/2015   CO2 28 03/28/2015   TSH 1.96 09/16/2014   INR 1.5* 03/13/2014   HGBA1C 3.9* 12/15/2014    Dg Chest 2 View  03/28/2015  CLINICAL DATA:  Cough with some mucus production last night, headache for 3-4 days, leg swelling, history pneumonia, asthma, hypertension, obesity, cirrhosis with splenomegaly EXAM: CHEST  2 VIEW COMPARISON:  06/11/2013 FINDINGS: Enlargement of cardiac silhouette with pulmonary vascular congestion. Minimal central peribronchial thickening, chronic. Linear subsegmental atelectasis versus scarring LEFT base. No definite acute infiltrate, pleural effusion or pneumothorax. Osseous structures unremarkable. IMPRESSION: Enlargement of cardiac silhouette with pulmonary vascular congestion. Minimal bronchitic changes with LEFT basilar atelectasis versus scarring. No definite acute infiltrate. Electronically Signed   By: Ulyses Southward M.D.   On: 03/28/2015 13:25   US Venous Img Lower Bilateral  03/28/2015  CLINICAL DATA:  Bilateral leg pain and swelling for 2 months EXAM: BILATERAL LOWER EXTREMITY VENOUS DOPPLER ULTRASOUND TECHNIQUE: Gray-scale sonography with graded compression, as well as color Doppler and duplex ultrasound were performed to evaluate the lower extremity deep venous systems from the level of the common femoral vein and including the common femoral, femoral, profunda femoral, popliteal and calf veins including the posterior tibial, peroneal and gastrocnemius veins when visible. The superficial great saphenous vein was  also interrogated. Spectral Doppler was utilized to evaluate flow at rest and with distal augmentation maneuvers in the common femoral, femoral and popliteal veins. COMPARISON:  None. FINDINGS: RIGHT LOWER EXTREMITY Common Femoral Vein: No evidence of thrombus. Normal compressibility, respiratory phasicity and response to augmentation. Saphenofemoral Junction: No evidence of thrombus. Normal compressibility and flow on color Doppler imaging. Profunda Femoral Vein: No evidence of thrombus. Normal compressibility and flow on color Doppler imaging. Femoral Vein: No evidence of thrombus. Normal compressibility, respiratory phasicity and response to augmentation. Popliteal Vein: No evidence of thrombus. Normal compressibility, respiratory phasicity and response to augmentation. Calf Veins: No evidence of thrombus. Normal compressibility and flow on color Doppler imaging. Superficial Great Saphenous Vein: No evidence of thrombus. Normal compressibility and flow on color Doppler imaging. Venous Reflux:  None. Other Findings:  None. LEFT LOWER EXTREMITY Common Femoral Vein: No evidence of thrombus. Normal compressibility, respiratory phasicity and response to augmentation. Saphenofemoral Junction: No evidence of thrombus. Normal compressibility and flow on color Doppler imaging. Profunda Femoral Vein: No evidence of thrombus. Normal compressibility and flow on color Doppler imaging. Femoral Vein: No evidence of thrombus. Normal compressibility, respiratory phasicity and response to augmentation. Popliteal Vein: No evidence of thrombus. Normal compressibility, respiratory phasicity and response to augmentation. Calf Veins:  No evidence of thrombus. Normal compressibility and flow on color Doppler imaging. Superficial Great Saphenous Vein: No evidence of thrombus. Normal compressibility and flow on color Doppler imaging. Venous Reflux:  None. Other Findings:  None. IMPRESSION: No evidence of deep venous thrombosis bilaterally.  Electronically Signed   By: Alcide Clever M.D.   On: 03/28/2015 17:25    Assessment & Plan:   There are no diagnoses linked to this encounter. I am having Ms. Mills maintain her Acidophilus, vitamin B-12, cholecalciferol, triamcinolone cream, loratadine, furosemide, potassium chloride, losartan, levothyroxine, morphine, albuterol, and amoxicillin-clavulanate.  No orders of the defined types were placed in this encounter.     Follow-up: No Follow-up on file.  Sonda Primes, MD

## 2015-05-05 ENCOUNTER — Ambulatory Visit (HOSPITAL_BASED_OUTPATIENT_CLINIC_OR_DEPARTMENT_OTHER): Payer: Medicare PPO | Admitting: Hematology & Oncology

## 2015-05-05 ENCOUNTER — Other Ambulatory Visit (HOSPITAL_BASED_OUTPATIENT_CLINIC_OR_DEPARTMENT_OTHER): Payer: Medicare PPO

## 2015-05-05 ENCOUNTER — Encounter: Payer: Self-pay | Admitting: Hematology & Oncology

## 2015-05-05 VITALS — BP 138/51 | HR 77 | Temp 97.7°F | Resp 16 | Ht 63.0 in | Wt 276.0 lb

## 2015-05-05 DIAGNOSIS — D61818 Other pancytopenia: Secondary | ICD-10-CM

## 2015-05-05 DIAGNOSIS — D72819 Decreased white blood cell count, unspecified: Secondary | ICD-10-CM

## 2015-05-05 DIAGNOSIS — D693 Immune thrombocytopenic purpura: Secondary | ICD-10-CM

## 2015-05-05 DIAGNOSIS — R161 Splenomegaly, not elsewhere classified: Secondary | ICD-10-CM

## 2015-05-05 DIAGNOSIS — K7581 Nonalcoholic steatohepatitis (NASH): Secondary | ICD-10-CM

## 2015-05-05 LAB — CBC WITH DIFFERENTIAL (CANCER CENTER ONLY)
BASO#: 0 10*3/uL (ref 0.0–0.2)
BASO%: 0.7 % (ref 0.0–2.0)
EOS%: 2.7 % (ref 0.0–7.0)
Eosinophils Absolute: 0.1 10*3/uL (ref 0.0–0.5)
HCT: 34.8 % (ref 34.8–46.6)
HGB: 11.5 g/dL — ABNORMAL LOW (ref 11.6–15.9)
LYMPH#: 0.6 10*3/uL — ABNORMAL LOW (ref 0.9–3.3)
LYMPH%: 13.7 % — AB (ref 14.0–48.0)
MCH: 32.4 pg (ref 26.0–34.0)
MCHC: 33 g/dL (ref 32.0–36.0)
MCV: 98 fL (ref 81–101)
MONO#: 0.5 10*3/uL (ref 0.1–0.9)
MONO%: 11 % (ref 0.0–13.0)
NEUT#: 2.9 10*3/uL (ref 1.5–6.5)
NEUT%: 71.9 % (ref 39.6–80.0)
PLATELETS: 51 10*3/uL — AB (ref 145–400)
RBC: 3.55 10*6/uL — ABNORMAL LOW (ref 3.70–5.32)
RDW: 15.2 % (ref 11.1–15.7)
WBC: 4.1 10*3/uL (ref 3.9–10.0)

## 2015-05-05 LAB — CHCC SATELLITE - SMEAR

## 2015-05-05 LAB — TECHNOLOGIST REVIEW CHCC SATELLITE

## 2015-05-05 NOTE — Progress Notes (Signed)
Hematology and Oncology Follow Up Visit  Lori Mills [id number] [birth year] 64 y.o. 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 Mills is back for follow-up. We first saw her back in September. At that point time, I thought that the thrombocytopenia was mostly from her cirrhosis. She also has psoriatic arthritis. As such, psoriasis certainly may also be a factor with the thrombocytopenia.  She is totally asymptomatic.  Her psoriasis seems to be flaring up on her. She takes steroid cream. She sees a dermatologist. She does not see a rheumatologist.  She's had no issues with pain.  She's had no bleeding. There's not been any bruising.  She's had no change in bowel or bladder habits.'  She has been checked for hepatitis and HIV. These are all negative.  She had a good Thanksgiving. Her husband passed away recently. As such, she and her family went to the beach to celebrate his life.  Medications:  Current outpatient prescriptions:  .  albuterol (PROVENTIL HFA;VENTOLIN HFA) 108 (90 BASE) MCG/ACT inhaler, Inhale 1-2 puffs into the lungs every 6 (six) hours as needed for wheezing or shortness of breath., Disp: 1 Inhaler, Rfl: 0 .  Cholecalciferol (VITAMIN D3) 1000 UNITS tablet, Take 1,000 Units by mouth daily.  , Disp: , Rfl:  .  escitalopram (LEXAPRO) 5 MG tablet, Take 1 tablet (5 mg total) by mouth daily., Disp: 30 tablet, Rfl: 5 .  fluconazole (DIFLUCAN) 150 MG tablet, Take 1 tablet (150 mg total) by mouth once., Disp: 1 tablet, Rfl: 1 .  furosemide (LASIX) 40 MG tablet, take 1 tablet by mouth if needed (WATER PILL), Disp: 30 tablet, Rfl: 5 .  Lactobacillus (ACIDOPHILUS) CAPS, Take by mouth as needed.  , Disp: , Rfl:  .  levothyroxine (SYNTHROID, LEVOTHROID) 50 MCG tablet, take 1 tablet by mouth once daily, Disp: 30 tablet, Rfl: 11 .  loratadine (CLARITIN) 10 MG  tablet, Take 1 tablet (10 mg total) by mouth daily. For allergies, Disp: 100 tablet, Rfl: 3 .  losartan (COZAAR) 100 MG tablet, take 1 tablet by mouth once daily, Disp: 30 tablet, Rfl: 11 .  morphine (MSIR) 15 MG tablet, Take 0.5-1 tablets (7.5-15 mg total) by mouth every 6 (six) hours as needed for severe pain., Disp: 120 tablet, Rfl: 0 .  potassium chloride (KLOR-CON 10) 10 MEQ tablet, Take 1 tablet (10 mEq total) by mouth daily., Disp: 30 tablet, Rfl: 5 .  triamcinolone cream (KENALOG) 0.1 %, Apply topically 2 (two) times daily., Disp: 30 g, Rfl: 1 .  vitamin B-12 (CYANOCOBALAMIN) 1000 MCG tablet, Take 1,000 mcg by mouth daily.  , Disp: , Rfl:   Allergies:  Allergies  Allergen Reactions  . Cephalexin Itching    Can take Augmentin  . Doxycycline Photosensitivity  . Enalapril Maleate   . Hydrochlorothiazide W-Triamterene   . Ibuprofen     REACTION: pain in abd  . Oxycodone     rash  . Penicillins   . Spironolactone   . Bactrim [Sulfamethoxazole-Trimethoprim] Rash    Past Medical History, Surgical history, Social history, and Family History were reviewed and updated.  Review of Systems: As above  Physical Exam:  height is 5\' 3"  (1.6 m) and weight is 276 lb (125.193 kg). Her oral temperature is 97.7 F (36.5 C). Her blood pressure is 138/51 and her pulse is 77. Her respiration is 16.  Wt Readings from Last 3 Encounters:  05/05/15 276 lb (125.193 kg)  04/06/15 287 lb (130.182 kg)  03/28/15 275 lb (124.739 kg)     Obese white female in no obvious distress. Head and neck exam shows no ocular or oral lesions. There are no palpable cervical or supraclavicular lymph nodes. Lungs are clear. Cardiac exam regular rate and rhythm with no murmurs, rubs or bruits. Abdomen is soft. She is morbidly obese. She has no fluid wave. There is no palpable liver or spleen tip. Extremities shows some chronic nonpitting edema of the lower legs. Skin exam shows numerous psoriatic plaques. There is no  ecchymoses or petechia. Neurological exam shows no focal neurological deficits.  Lab Results  Component Value Date   WBC 4.1 05/05/2015   HGB 11.5* 05/05/2015   HCT 34.8 05/05/2015   MCV 98 05/05/2015   PLT 51* 05/05/2015     Chemistry      Component Value Date/Time   NA 138 03/28/2015 1346   K 3.8 03/28/2015 1346   CL 107 03/28/2015 1346   CO2 28 03/28/2015 1346   BUN 11 03/28/2015 1346   CREATININE 0.60 03/28/2015 1346      Component Value Date/Time   CALCIUM 8.5* 03/28/2015 1346   ALKPHOS 132* 03/28/2015 1346   AST 34 03/28/2015 1346   ALT 20 03/28/2015 1346   BILITOT 4.3* 03/28/2015 1346         Impression and Plan: Ms. Mills is a 64 year old white female. She has chronic thrombocytopenia. She is asymptomatic with this. I suspect she will have some hypersplenism secondary to the NASH.  I don't see any hematologic malignancy that is causing the thrombocytopenia. Her white cell count is better. She has a relatively normal white cell differential which is encouraging.  I think we've probably get her back in 3 months.  I do not see a need for any type of invasive studies on her.  I spent about 30 minutes with her. I looked at her blood smear. The blood smear looked benign. She had an good maturation of her blood cells and white blood cells. Her platelets were decreased in number. Platelets were well granulated. She had a few large platelets.   Josph Macho, MD 11/30/201610:25 AM

## 2015-06-11 ENCOUNTER — Ambulatory Visit (INDEPENDENT_AMBULATORY_CARE_PROVIDER_SITE_OTHER): Payer: Medicare Other | Admitting: Internal Medicine

## 2015-06-11 ENCOUNTER — Encounter: Payer: Self-pay | Admitting: Internal Medicine

## 2015-06-11 DIAGNOSIS — F329 Major depressive disorder, single episode, unspecified: Secondary | ICD-10-CM | POA: Diagnosis not present

## 2015-06-11 DIAGNOSIS — K7460 Unspecified cirrhosis of liver: Secondary | ICD-10-CM

## 2015-06-11 DIAGNOSIS — G8929 Other chronic pain: Secondary | ICD-10-CM

## 2015-06-11 DIAGNOSIS — L409 Psoriasis, unspecified: Secondary | ICD-10-CM

## 2015-06-11 DIAGNOSIS — F32A Depression, unspecified: Secondary | ICD-10-CM

## 2015-06-11 DIAGNOSIS — D693 Immune thrombocytopenic purpura: Secondary | ICD-10-CM

## 2015-06-11 DIAGNOSIS — M5440 Lumbago with sciatica, unspecified side: Secondary | ICD-10-CM

## 2015-06-11 MED ORDER — HYDROMORPHONE HCL 2 MG PO TABS: 1.0000 mg | ORAL_TABLET | Freq: Four times a day (QID) | ORAL | Status: DC | PRN

## 2015-06-11 MED ORDER — HYDROMORPHONE HCL 2 MG PO TABS: 1.0000 mg | ORAL_TABLET | ORAL | Status: DC | PRN

## 2015-06-11 NOTE — Assessment & Plan Note (Signed)
Dr Joellyn Rued: We recommend that you have serum alpha fetoprotein and ultrasound or MRI of liver every six months Labs

## 2015-06-11 NOTE — Progress Notes (Signed)
Pre visit review using our clinic review tool, if applicable. No additional management support is needed unless otherwise documented below in the visit note. 

## 2015-06-11 NOTE — Progress Notes (Signed)
Subjective:  Patient ID: Lori Mills, female    DOB: 1951-02-25  Age: 65 y.o. MRN: [id number]  CC: No chief complaint on file.   HPI Lori Mills presents for liver cirrhosis, HTN, depression f/u. MS is giving side effects  Outpatient Prescriptions Prior to Visit  Medication Sig Dispense Refill  . albuterol (PROVENTIL HFA;VENTOLIN HFA) 108 (90 BASE) MCG/ACT inhaler Inhale 1-2 puffs into the lungs every 6 (six) hours as needed for wheezing or shortness of breath. 1 Inhaler 0  . Cholecalciferol (VITAMIN D3) 1000 UNITS tablet Take 1,000 Units by mouth daily.      Marland Kitchen escitalopram (LEXAPRO) 5 MG tablet Take 1 tablet (5 mg total) by mouth daily. 30 tablet 5  . fluconazole (DIFLUCAN) 150 MG tablet Take 1 tablet (150 mg total) by mouth once. 1 tablet 1  . furosemide (LASIX) 40 MG tablet take 1 tablet by mouth if needed (WATER PILL) 30 tablet 5  . Lactobacillus (ACIDOPHILUS) CAPS Take by mouth as needed.      Marland Kitchen levothyroxine (SYNTHROID, LEVOTHROID) 50 MCG tablet take 1 tablet by mouth once daily 30 tablet 11  . loratadine (CLARITIN) 10 MG tablet Take 1 tablet (10 mg total) by mouth daily. For allergies 100 tablet 3  . losartan (COZAAR) 100 MG tablet take 1 tablet by mouth once daily 30 tablet 11  . morphine (MSIR) 15 MG tablet Take 0.5-1 tablets (7.5-15 mg total) by mouth every 6 (six) hours as needed for severe pain. 120 tablet 0  . potassium chloride (KLOR-CON 10) 10 MEQ tablet Take 1 tablet (10 mEq total) by mouth daily. 30 tablet 5  . triamcinolone cream (KENALOG) 0.1 % Apply topically 2 (two) times daily. 30 g 1  . vitamin B-12 (CYANOCOBALAMIN) 1000 MCG tablet Take 1,000 mcg by mouth daily.       No facility-administered medications prior to visit.    ROS Review of Systems  Constitutional: Positive for fatigue. Negative for chills, activity change, appetite change and unexpected weight change.  HENT: Negative for congestion, mouth sores and sinus pressure.   Eyes: Negative for  visual disturbance.  Respiratory: Negative for cough, chest tightness and wheezing.   Gastrointestinal: Negative for nausea, abdominal pain and constipation.  Genitourinary: Negative for frequency, difficulty urinating and vaginal pain.  Musculoskeletal: Positive for back pain, joint swelling and arthralgias. Negative for gait problem.  Skin: Positive for color change and rash. Negative for pallor.  Neurological: Negative for dizziness, tremors, weakness, light-headedness, numbness and headaches.  Psychiatric/Behavioral: Positive for sleep disturbance and dysphoric mood. Negative for suicidal ideas, behavioral problems and confusion. The patient is nervous/anxious.     Objective:  BP 120/54 mmHg  Pulse 81  Wt 271 lb (122.925 kg)  SpO2 94%  BP Readings from Last 3 Encounters:  06/11/15 120/54  05/05/15 138/51  04/06/15 120/54    Wt Readings from Last 3 Encounters:  06/11/15 271 lb (122.925 kg)  05/05/15 276 lb (125.193 kg)  04/06/15 287 lb (130.182 kg)    Physical Exam  Constitutional: She appears well-developed. No distress.  HENT:  Head: Normocephalic.  Right Ear: External ear normal.  Left Ear: External ear normal.  Nose: Nose normal.  Mouth/Throat: Oropharynx is clear and moist.  Eyes: Conjunctivae are normal. Pupils are equal, round, and reactive to light. Right eye exhibits no discharge. Left eye exhibits no discharge.  Neck: Normal range of motion. Neck supple. No JVD present. No tracheal deviation present. No thyromegaly present.  Cardiovascular: Normal rate, regular  rhythm and normal heart sounds.   Pulmonary/Chest: No stridor. No respiratory distress. She has no wheezes.  Abdominal: Soft. Bowel sounds are normal. She exhibits no distension and no mass. There is no tenderness. There is no rebound and no guarding.  Musculoskeletal: She exhibits tenderness. She exhibits no edema.  Lymphadenopathy:    She has no cervical adenopathy.  Neurological: She displays normal  reflexes. No cranial nerve deficit. She exhibits normal muscle tone. Coordination abnormal.  Skin: Rash noted. No erythema.  Psychiatric: She has a normal mood and affect. Her behavior is normal. Judgment and thought content normal.  psoriasis Obese LS is tender Large abd fat apron  Lab Results  Component Value Date   WBC 4.1 05/05/2015   HGB 11.5* 05/05/2015   HCT 34.8 05/05/2015   PLT 51* 05/05/2015   GLUCOSE 87 03/28/2015   CHOL 164 06/02/2010   TRIG 97.0 06/02/2010   HDL 43.50 06/02/2010   LDLCALC 101* 06/02/2010   ALT 20 03/28/2015   AST 34 03/28/2015   NA 138 03/28/2015   K 3.8 03/28/2015   CL 107 03/28/2015   CREATININE 0.60 03/28/2015   BUN 11 03/28/2015   CO2 28 03/28/2015   TSH 1.96 09/16/2014   INR 1.5* 03/13/2014   HGBA1C 3.9* 12/15/2014    Dg Chest 2 View  03/28/2015  CLINICAL DATA:  Cough with some mucus production last night, headache for 3-4 days, leg swelling, history pneumonia, asthma, hypertension, obesity, cirrhosis with splenomegaly EXAM: CHEST  2 VIEW COMPARISON:  06/11/2013 FINDINGS: Enlargement of cardiac silhouette with pulmonary vascular congestion. Minimal central peribronchial thickening, chronic. Linear subsegmental atelectasis versus scarring LEFT base. No definite acute infiltrate, pleural effusion or pneumothorax. Osseous structures unremarkable. IMPRESSION: Enlargement of cardiac silhouette with pulmonary vascular congestion. Minimal bronchitic changes with LEFT basilar atelectasis versus scarring. No definite acute infiltrate. Electronically Signed   By: Ulyses Southward M.D.   On: 03/28/2015 13:25   US Venous Img Lower Bilateral  03/28/2015  CLINICAL DATA:  Bilateral leg pain and swelling for 2 months EXAM: BILATERAL LOWER EXTREMITY VENOUS DOPPLER ULTRASOUND TECHNIQUE: Gray-scale sonography with graded compression, as well as color Doppler and duplex ultrasound were performed to evaluate the lower extremity deep venous systems from the level of the  common femoral vein and including the common femoral, femoral, profunda femoral, popliteal and calf veins including the posterior tibial, peroneal and gastrocnemius veins when visible. The superficial great saphenous vein was also interrogated. Spectral Doppler was utilized to evaluate flow at rest and with distal augmentation maneuvers in the common femoral, femoral and popliteal veins. COMPARISON:  None. FINDINGS: RIGHT LOWER EXTREMITY Common Femoral Vein: No evidence of thrombus. Normal compressibility, respiratory phasicity and response to augmentation. Saphenofemoral Junction: No evidence of thrombus. Normal compressibility and flow on color Doppler imaging. Profunda Femoral Vein: No evidence of thrombus. Normal compressibility and flow on color Doppler imaging. Femoral Vein: No evidence of thrombus. Normal compressibility, respiratory phasicity and response to augmentation. Popliteal Vein: No evidence of thrombus. Normal compressibility, respiratory phasicity and response to augmentation. Calf Veins: No evidence of thrombus. Normal compressibility and flow on color Doppler imaging. Superficial Great Saphenous Vein: No evidence of thrombus. Normal compressibility and flow on color Doppler imaging. Venous Reflux:  None. Other Findings:  None. LEFT LOWER EXTREMITY Common Femoral Vein: No evidence of thrombus. Normal compressibility, respiratory phasicity and response to augmentation. Saphenofemoral Junction: No evidence of thrombus. Normal compressibility and flow on color Doppler imaging. Profunda Femoral Vein: No evidence of thrombus. Normal  compressibility and flow on color Doppler imaging. Femoral Vein: No evidence of thrombus. Normal compressibility, respiratory phasicity and response to augmentation. Popliteal Vein: No evidence of thrombus. Normal compressibility, respiratory phasicity and response to augmentation. Calf Veins: No evidence of thrombus. Normal compressibility and flow on color Doppler  imaging. Superficial Great Saphenous Vein: No evidence of thrombus. Normal compressibility and flow on color Doppler imaging. Venous Reflux:  None. Other Findings:  None. IMPRESSION: No evidence of deep venous thrombosis bilaterally. Electronically Signed   By: Alcide Clever M.D.   On: 03/28/2015 17:25    Assessment & Plan:   There are no diagnoses linked to this encounter. I am having Ms. Mills maintain her Acidophilus, vitamin B-12, cholecalciferol, triamcinolone cream, loratadine, potassium chloride, losartan, levothyroxine, morphine, albuterol, furosemide, fluconazole, and escitalopram.  No orders of the defined types were placed in this encounter.     Follow-up: No Follow-up on file.  Sonda Primes, MD

## 2015-06-11 NOTE — Assessment & Plan Note (Addendum)
Better - lost wt  ?1/17 need for abdomen reduction - fat apron is affecting balance

## 2015-06-11 NOTE — Assessment & Plan Note (Signed)
Labs

## 2015-06-11 NOTE — Patient Instructions (Signed)
?  abdomen reduction - fat apron is affecting balance

## 2015-06-11 NOTE — Assessment & Plan Note (Signed)
Grieving Discussed

## 2015-06-11 NOTE — Assessment & Plan Note (Signed)
D/c MS - too strong 1/17 will try Dilaudid  Potential benefits of a long term opioids use as well as potential risks (i.e. addiction risk, apnea etc) and complications (i.e. Somnolence, constipation and others) were explained to the patient and were aknowledged.

## 2015-06-11 NOTE — Assessment & Plan Note (Signed)
On Rx 

## 2015-07-07 ENCOUNTER — Encounter: Payer: Self-pay | Admitting: Internal Medicine

## 2015-07-07 ENCOUNTER — Other Ambulatory Visit (INDEPENDENT_AMBULATORY_CARE_PROVIDER_SITE_OTHER): Payer: Medicare Other

## 2015-07-07 ENCOUNTER — Ambulatory Visit (INDEPENDENT_AMBULATORY_CARE_PROVIDER_SITE_OTHER): Payer: Medicare Other | Admitting: Internal Medicine

## 2015-07-07 VITALS — BP 138/40 | HR 107 | Wt 275.0 lb

## 2015-07-07 DIAGNOSIS — E038 Other specified hypothyroidism: Secondary | ICD-10-CM

## 2015-07-07 DIAGNOSIS — M5440 Lumbago with sciatica, unspecified side: Secondary | ICD-10-CM | POA: Diagnosis not present

## 2015-07-07 DIAGNOSIS — G8929 Other chronic pain: Secondary | ICD-10-CM

## 2015-07-07 DIAGNOSIS — E034 Atrophy of thyroid (acquired): Secondary | ICD-10-CM

## 2015-07-07 DIAGNOSIS — N183 Chronic kidney disease, stage 3 unspecified: Secondary | ICD-10-CM

## 2015-07-07 DIAGNOSIS — F329 Major depressive disorder, single episode, unspecified: Secondary | ICD-10-CM | POA: Diagnosis not present

## 2015-07-07 DIAGNOSIS — R17 Unspecified jaundice: Secondary | ICD-10-CM

## 2015-07-07 DIAGNOSIS — F32A Depression, unspecified: Secondary | ICD-10-CM

## 2015-07-07 DIAGNOSIS — K7460 Unspecified cirrhosis of liver: Secondary | ICD-10-CM | POA: Diagnosis not present

## 2015-07-07 LAB — CBC WITH DIFFERENTIAL/PLATELET
BASOS ABS: 0 10*3/uL (ref 0.0–0.1)
Basophils Relative: 0.2 % (ref 0.0–3.0)
EOS PCT: 1.7 % (ref 0.0–5.0)
Eosinophils Absolute: 0.1 10*3/uL (ref 0.0–0.7)
HEMATOCRIT: 29 % — AB (ref 36.0–46.0)
Hemoglobin: 9.7 g/dL — ABNORMAL LOW (ref 12.0–15.0)
LYMPHS PCT: 11.8 % — AB (ref 12.0–46.0)
Lymphs Abs: 0.6 10*3/uL — ABNORMAL LOW (ref 0.7–4.0)
MCHC: 33.4 g/dL (ref 30.0–36.0)
MCV: 99 fl (ref 78.0–100.0)
MONOS PCT: 9.3 % (ref 3.0–12.0)
Monocytes Absolute: 0.5 10*3/uL (ref 0.1–1.0)
NEUTROS ABS: 3.7 10*3/uL (ref 1.4–7.7)
NEUTROS PCT: 77 % (ref 43.0–77.0)
PLATELETS: 82 10*3/uL — AB (ref 150.0–400.0)
RBC: 2.93 Mil/uL — ABNORMAL LOW (ref 3.87–5.11)
RDW: 16.6 % — ABNORMAL HIGH (ref 11.5–15.5)
WBC: 4.9 10*3/uL (ref 4.0–10.5)

## 2015-07-07 LAB — HEPATIC FUNCTION PANEL
ALBUMIN: 2.5 g/dL — AB (ref 3.5–5.2)
ALK PHOS: 143 U/L — AB (ref 39–117)
ALT: 17 U/L (ref 0–35)
AST: 31 U/L (ref 0–37)
Bilirubin, Direct: 3.6 mg/dL — ABNORMAL HIGH (ref 0.0–0.3)
TOTAL PROTEIN: 7 g/dL (ref 6.0–8.3)
Total Bilirubin: 7.9 mg/dL — ABNORMAL HIGH (ref 0.2–1.2)

## 2015-07-07 LAB — BASIC METABOLIC PANEL
BUN: 30 mg/dL — ABNORMAL HIGH (ref 6–23)
CHLORIDE: 98 meq/L (ref 96–112)
CO2: 29 meq/L (ref 19–32)
Calcium: 8.7 mg/dL (ref 8.4–10.5)
Creatinine, Ser: 2.56 mg/dL — ABNORMAL HIGH (ref 0.40–1.20)
GFR: 19.92 mL/min — ABNORMAL LOW (ref >60.00)
Glucose, Bld: 121 mg/dL — ABNORMAL HIGH (ref 70–99)
POTASSIUM: 3.5 meq/L (ref 3.5–5.1)
SODIUM: 136 meq/L (ref 135–145)

## 2015-07-07 LAB — APTT: APTT: 47.8 s — AB (ref 23.4–32.7)

## 2015-07-07 LAB — PROTIME-INR
INR: 1.9 ratio — ABNORMAL HIGH (ref 0.8–1.0)
Prothrombin Time: 20.6 s — ABNORMAL HIGH (ref 9.6–13.1)

## 2015-07-07 LAB — AMMONIA: AMMONIA: 33 umol/L (ref 11–35)

## 2015-07-07 MED ORDER — TRAMADOL HCL 50 MG PO TABS: 25.0000 mg | ORAL_TABLET | Freq: Four times a day (QID) | ORAL | Status: DC | PRN

## 2015-07-07 NOTE — Progress Notes (Signed)
Subjective:  Patient ID: Lori Mills, female    DOB: [birth year]  Age: 65 y.o. MRN: [id number]  CC: No chief complaint on file.   HPI Lori Mills presents for LLE knot C/o fingertips numbness w/cold exposure C/o urinary incontinence C/o shakiness, stress Dilaudid was making her cry - stopped  Outpatient Prescriptions Prior to Visit  Medication Sig Dispense Refill  . albuterol (PROVENTIL HFA;VENTOLIN HFA) 108 (90 BASE) MCG/ACT inhaler Inhale 1-2 puffs into the lungs every 6 (six) hours as needed for wheezing or shortness of breath. 1 Inhaler 0  . Cholecalciferol (VITAMIN D3) 1000 UNITS tablet Take 1,000 Units by mouth daily.      . furosemide (LASIX) 40 MG tablet take 1 tablet by mouth if needed (WATER PILL) 30 tablet 5  . Lactobacillus (ACIDOPHILUS) CAPS Take by mouth as needed.      Marland Kitchen levothyroxine (SYNTHROID, LEVOTHROID) 50 MCG tablet take 1 tablet by mouth once daily 30 tablet 11  . loratadine (CLARITIN) 10 MG tablet Take 1 tablet (10 mg total) by mouth daily. For allergies 100 tablet 3  . potassium chloride (KLOR-CON 10) 10 MEQ tablet Take 1 tablet (10 mEq total) by mouth daily. 30 tablet 5  . triamcinolone cream (KENALOG) 0.1 % Apply topically 2 (two) times daily. 30 g 1  . vitamin B-12 (CYANOCOBALAMIN) 1000 MCG tablet Take 1,000 mcg by mouth daily.      . fluconazole (DIFLUCAN) 150 MG tablet Take 1 tablet (150 mg total) by mouth once. 1 tablet 1  . HYDROmorphone (DILAUDID) 2 MG tablet Take 0.5-1 tablets (1-2 mg total) by mouth every 6 (six) hours as needed for severe pain. Please fill on or after 08/19/15 120 tablet 0  . losartan (COZAAR) 100 MG tablet take 1 tablet by mouth once daily (Patient not taking: Reported on 07/07/2015) 30 tablet 11  . escitalopram (LEXAPRO) 5 MG tablet Take 1 tablet (5 mg total) by mouth daily. (Patient not taking: Reported on 07/07/2015) 30 tablet 5   No facility-administered medications prior to visit.    ROS Review of Systems    Constitutional: Positive for chills and fatigue. Negative for activity change, appetite change and unexpected weight change.  HENT: Negative for congestion, mouth sores, rhinorrhea and sinus pressure.   Eyes: Negative for visual disturbance.  Respiratory: Negative for cough and chest tightness.   Cardiovascular: Positive for leg swelling.  Gastrointestinal: Positive for nausea. Negative for vomiting, abdominal pain and abdominal distention.  Genitourinary: Positive for urgency and frequency. Negative for enuresis, difficulty urinating and vaginal pain.  Musculoskeletal: Positive for back pain and arthralgias. Negative for gait problem.  Skin: Positive for color change and rash. Negative for pallor and wound.  Neurological: Positive for weakness. Negative for dizziness, tremors, numbness and headaches.  Psychiatric/Behavioral: Negative for suicidal ideas, confusion and sleep disturbance. The patient is nervous/anxious.     Objective:  BP 138/40 mmHg  Pulse 107  Wt 275 lb (124.739 kg)  SpO2 99%  BP Readings from Last 3 Encounters:  07/07/15 138/40  06/11/15 120/54  05/05/15 138/51    Wt Readings from Last 3 Encounters:  07/07/15 275 lb (124.739 kg)  06/11/15 271 lb (122.925 kg)  05/05/15 276 lb (125.193 kg)    Physical Exam  Constitutional: She appears well-developed. No distress.  HENT:  Head: Normocephalic.  Right Ear: External ear normal.  Left Ear: External ear normal.  Nose: Nose normal.  Mouth/Throat: Oropharynx is clear and moist.  Eyes: Conjunctivae are normal. Pupils are  equal, round, and reactive to light. Right eye exhibits no discharge. Left eye exhibits no discharge. Scleral icterus is present.  Neck: Normal range of motion. Neck supple. No JVD present. No tracheal deviation present. No thyromegaly present.  Cardiovascular: Normal rate, regular rhythm and normal heart sounds.   Pulmonary/Chest: No stridor. No respiratory distress. She has no wheezes.   Abdominal: Soft. Bowel sounds are normal. She exhibits no distension and no mass. There is no tenderness. There is no rebound and no guarding.  Musculoskeletal: She exhibits edema and tenderness.  Lymphadenopathy:    She has no cervical adenopathy.  Neurological: She displays normal reflexes. No cranial nerve deficit. She exhibits normal muscle tone. Coordination abnormal.  Skin: Rash noted. There is erythema. No pallor.  Psychiatric: She has a normal mood and affect. Her behavior is normal. Judgment and thought content normal.  Obese Icteric eyes No asterixis   Lab Results  Component Value Date   WBC 4.9 07/07/2015   HGB 9.7* 07/07/2015   HCT 29.0* 07/07/2015   PLT 82.0* 07/07/2015   GLUCOSE 121* 07/07/2015   CHOL 164 06/02/2010   TRIG 97.0 06/02/2010   HDL 43.50 06/02/2010   LDLCALC 101* 06/02/2010   ALT 17 07/07/2015   AST 31 07/07/2015   NA 136 07/07/2015   K 3.5 07/07/2015   CL 98 07/07/2015   CREATININE 2.56* 07/07/2015   BUN 30* 07/07/2015   CO2 29 07/07/2015   TSH 3.75 07/07/2015   INR 1.9* 07/07/2015   HGBA1C 3.9* 12/15/2014    Dg Chest 2 View  03/28/2015  CLINICAL DATA:  Cough with some mucus production last night, headache for 3-4 days, leg swelling, history pneumonia, asthma, hypertension, obesity, cirrhosis with splenomegaly EXAM: CHEST  2 VIEW COMPARISON:  06/11/2013 FINDINGS: Enlargement of cardiac silhouette with pulmonary vascular congestion. Minimal central peribronchial thickening, chronic. Linear subsegmental atelectasis versus scarring LEFT base. No definite acute infiltrate, pleural effusion or pneumothorax. Osseous structures unremarkable. IMPRESSION: Enlargement of cardiac silhouette with pulmonary vascular congestion. Minimal bronchitic changes with LEFT basilar atelectasis versus scarring. No definite acute infiltrate. Electronically Signed   By: Ulyses Southward M.D.   On: 03/28/2015 13:25   US Venous Img Lower Bilateral  03/28/2015  CLINICAL DATA:   Bilateral leg pain and swelling for 2 months EXAM: BILATERAL LOWER EXTREMITY VENOUS DOPPLER ULTRASOUND TECHNIQUE: Gray-scale sonography with graded compression, as well as color Doppler and duplex ultrasound were performed to evaluate the lower extremity deep venous systems from the level of the common femoral vein and including the common femoral, femoral, profunda femoral, popliteal and calf veins including the posterior tibial, peroneal and gastrocnemius veins when visible. The superficial great saphenous vein was also interrogated. Spectral Doppler was utilized to evaluate flow at rest and with distal augmentation maneuvers in the common femoral, femoral and popliteal veins. COMPARISON:  None. FINDINGS: RIGHT LOWER EXTREMITY Common Femoral Vein: No evidence of thrombus. Normal compressibility, respiratory phasicity and response to augmentation. Saphenofemoral Junction: No evidence of thrombus. Normal compressibility and flow on color Doppler imaging. Profunda Femoral Vein: No evidence of thrombus. Normal compressibility and flow on color Doppler imaging. Femoral Vein: No evidence of thrombus. Normal compressibility, respiratory phasicity and response to augmentation. Popliteal Vein: No evidence of thrombus. Normal compressibility, respiratory phasicity and response to augmentation. Calf Veins: No evidence of thrombus. Normal compressibility and flow on color Doppler imaging. Superficial Great Saphenous Vein: No evidence of thrombus. Normal compressibility and flow on color Doppler imaging. Venous Reflux:  None. Other Findings:  None. LEFT LOWER EXTREMITY Common Femoral Vein: No evidence of thrombus. Normal compressibility, respiratory phasicity and response to augmentation. Saphenofemoral Junction: No evidence of thrombus. Normal compressibility and flow on color Doppler imaging. Profunda Femoral Vein: No evidence of thrombus. Normal compressibility and flow on color Doppler imaging. Femoral Vein: No evidence of  thrombus. Normal compressibility, respiratory phasicity and response to augmentation. Popliteal Vein: No evidence of thrombus. Normal compressibility, respiratory phasicity and response to augmentation. Calf Veins: No evidence of thrombus. Normal compressibility and flow on color Doppler imaging. Superficial Great Saphenous Vein: No evidence of thrombus. Normal compressibility and flow on color Doppler imaging. Venous Reflux:  None. Other Findings:  None. IMPRESSION: No evidence of deep venous thrombosis bilaterally. Electronically Signed   By: Alcide Clever M.D.   On: 03/28/2015 17:25    Assessment & Plan:   Diagnoses and all orders for this visit:  Cirrhosis of liver without ascites, unspecified hepatic cirrhosis type (HCC) -     AFP tumor marker; Future -     Ammonia; Future -     Basic metabolic panel; Future -     CBC with Differential/Platelet; Future -     Hepatic function panel; Future -     APTT; Future -     Protime-INR; Future -     TSH; Future -     Vitamin B12; Future -     Urinalysis; Future -     T3, free; Future  Hypothyroidism due to acquired atrophy of thyroid -     AFP tumor marker; Future -     Ammonia; Future -     Basic metabolic panel; Future -     CBC with Differential/Platelet; Future -     Hepatic function panel; Future -     APTT; Future -     Protime-INR; Future -     TSH; Future -     Vitamin B12; Future -     Urinalysis; Future -     T3, free; Future  Depression -     AFP tumor marker; Future -     Ammonia; Future -     Basic metabolic panel; Future -     CBC with Differential/Platelet; Future -     Hepatic function panel; Future -     APTT; Future -     Protime-INR; Future -     TSH; Future -     Vitamin B12; Future -     Urinalysis; Future -     T3, free; Future  Chronic low back pain with sciatica, sciatica laterality unspecified, unspecified back pain laterality -     AFP tumor marker; Future -     Ammonia; Future -     Basic metabolic  panel; Future -     CBC with Differential/Platelet; Future -     Hepatic function panel; Future -     APTT; Future -     Protime-INR; Future -     TSH; Future -     Vitamin B12; Future -     Urinalysis; Future -     T3, free; Future  Other orders -     traMADol (ULTRAM) 50 MG tablet; Take 0.5-1 tablets (25-50 mg total) by mouth every 6 (six) hours as needed for severe pain.  I have discontinued Lori Mills fluconazole, escitalopram, and HYDROmorphone. I am also having her start on traMADol. Additionally, I am having her maintain her Acidophilus, vitamin B-12, cholecalciferol, triamcinolone cream, loratadine, potassium chloride,  losartan, levothyroxine, albuterol, and furosemide.  Meds ordered this encounter  Medications  . traMADol (ULTRAM) 50 MG tablet    Sig: Take 0.5-1 tablets (25-50 mg total) by mouth every 6 (six) hours as needed for severe pain.    Dispense:  120 tablet    Refill:  3     Follow-up: Return in about 4 weeks (around 08/04/2015) for a follow-up visit.  Sonda Primes, MD

## 2015-07-07 NOTE — Assessment & Plan Note (Signed)
2/17 Tramadol prn w/caution  Potential benefits of a long term opioids use as well as potential risks (i.e. addiction risk, apnea etc) and complications (i.e. Somnolence, constipation and others) were explained to the patient and were aknowledged.

## 2015-07-07 NOTE — Progress Notes (Signed)
Pre visit review using our clinic review tool, if applicable. No additional management support is needed unless otherwise documented below in the visit note. 

## 2015-07-07 NOTE — Assessment & Plan Note (Signed)
Pt was told she is not a candidate for a liver transplant at [facility] Dr Joellyn Rued: We recommend that you have serum alpha fetoprotein and ultrasound or MRI of liver every six months life-long--mainly to screen for liver cancer.  Jaundiced - labs ordered

## 2015-07-07 NOTE — Assessment & Plan Note (Signed)
D/c Lexapro - ?nausea

## 2015-07-07 NOTE — Assessment & Plan Note (Signed)
FT3, TSH

## 2015-07-08 DIAGNOSIS — R17 Unspecified jaundice: Secondary | ICD-10-CM | POA: Insufficient documentation

## 2015-07-08 DIAGNOSIS — N189 Chronic kidney disease, unspecified: Secondary | ICD-10-CM | POA: Insufficient documentation

## 2015-07-08 LAB — TSH: TSH: 3.75 u[IU]/mL (ref 0.35–4.50)

## 2015-07-08 LAB — AFP TUMOR MARKER: AFP-Tumor Marker: 2.5 ng/mL (ref <6.1)

## 2015-07-08 LAB — VITAMIN B12

## 2015-07-08 LAB — T3, FREE: T3, Free: 2.6 pg/mL (ref 2.3–4.2)

## 2015-07-08 NOTE — Assessment & Plan Note (Signed)
Worse Labs 

## 2015-07-08 NOTE — Assessment & Plan Note (Signed)
Worse Improve hydration Hold Furosemide

## 2015-07-12 ENCOUNTER — Other Ambulatory Visit (INDEPENDENT_AMBULATORY_CARE_PROVIDER_SITE_OTHER): Payer: Medicare Other

## 2015-07-12 ENCOUNTER — Telehealth: Payer: Self-pay | Admitting: Internal Medicine

## 2015-07-12 DIAGNOSIS — K6289 Other specified diseases of anus and rectum: Principal | ICD-10-CM

## 2015-07-12 DIAGNOSIS — K624 Stenosis of anus and rectum: Secondary | ICD-10-CM | POA: Diagnosis not present

## 2015-07-12 DIAGNOSIS — G8929 Other chronic pain: Secondary | ICD-10-CM | POA: Diagnosis not present

## 2015-07-12 LAB — URINALYSIS
Hgb urine dipstick: NEGATIVE
Ketones, ur: NEGATIVE
Leukocytes, UA: NEGATIVE
NITRITE: NEGATIVE
PH: 5.5 (ref 5.0–8.0)
SPECIFIC GRAVITY, URINE: 1.02 (ref 1.000–1.030)
Urine Glucose: NEGATIVE
Urobilinogen, UA: 4 — AB (ref 0.0–1.0)

## 2015-07-12 MED ORDER — FENTANYL 12 MCG/HR TD PT72: 12.5000 ug | MEDICATED_PATCH | TRANSDERMAL | Status: DC

## 2015-07-12 NOTE — Telephone Encounter (Signed)
Pt stopped in stating she can't take the traMADol (ULTRAM) 50 MG tablet [[id number] It upset her stomach and made her feel awful. She states you haven't try the Fentonal yet. Can you please give her at [phone]

## 2015-07-12 NOTE — Telephone Encounter (Signed)
I'll prescribe Fentanyl - carefull - make sur no oversedation Bring Tramadol to discard Pick up Fentanyl Rx Thx

## 2015-07-13 NOTE — Telephone Encounter (Signed)
Rx up front. Pt informed.

## 2015-07-16 ENCOUNTER — Ambulatory Visit: Payer: Medicare Other | Admitting: Internal Medicine

## 2015-07-29 ENCOUNTER — Telehealth: Payer: Self-pay

## 2015-07-29 ENCOUNTER — Inpatient Hospital Stay (HOSPITAL_COMMUNITY): Payer: Medicare Other

## 2015-07-29 ENCOUNTER — Encounter (HOSPITAL_COMMUNITY): Payer: Self-pay | Admitting: *Deleted

## 2015-07-29 ENCOUNTER — Inpatient Hospital Stay (HOSPITAL_COMMUNITY)
Admission: EM | Admit: 2015-07-29 | Discharge: 2015-07-31 | DRG: 871 | Disposition: A | Discharge status: Home / Self Care | Payer: Medicare Other | Attending: Internal Medicine | Admitting: Internal Medicine

## 2015-07-29 DIAGNOSIS — D649 Anemia, unspecified: Secondary | ICD-10-CM | POA: Diagnosis present

## 2015-07-29 DIAGNOSIS — I959 Hypotension, unspecified: Secondary | ICD-10-CM | POA: Diagnosis not present

## 2015-07-29 DIAGNOSIS — K7290 Hepatic failure, unspecified without coma: Secondary | ICD-10-CM | POA: Diagnosis present

## 2015-07-29 DIAGNOSIS — E43 Unspecified severe protein-calorie malnutrition: Secondary | ICD-10-CM | POA: Diagnosis present

## 2015-07-29 DIAGNOSIS — E039 Hypothyroidism, unspecified: Secondary | ICD-10-CM | POA: Diagnosis present

## 2015-07-29 DIAGNOSIS — D693 Immune thrombocytopenic purpura: Secondary | ICD-10-CM | POA: Diagnosis present

## 2015-07-29 DIAGNOSIS — Z885 Allergy status to narcotic agent status: Secondary | ICD-10-CM

## 2015-07-29 DIAGNOSIS — A419 Sepsis, unspecified organism: Secondary | ICD-10-CM | POA: Diagnosis present

## 2015-07-29 DIAGNOSIS — R601 Generalized edema: Secondary | ICD-10-CM | POA: Diagnosis present

## 2015-07-29 DIAGNOSIS — L4050 Arthropathic psoriasis, unspecified: Secondary | ICD-10-CM | POA: Diagnosis present

## 2015-07-29 DIAGNOSIS — M545 Low back pain: Secondary | ICD-10-CM | POA: Diagnosis present

## 2015-07-29 DIAGNOSIS — K652 Spontaneous bacterial peritonitis: Secondary | ICD-10-CM

## 2015-07-29 DIAGNOSIS — M1990 Unspecified osteoarthritis, unspecified site: Secondary | ICD-10-CM | POA: Diagnosis present

## 2015-07-29 DIAGNOSIS — K767 Hepatorenal syndrome: Secondary | ICD-10-CM | POA: Diagnosis present

## 2015-07-29 DIAGNOSIS — E872 Acidosis: Secondary | ICD-10-CM | POA: Diagnosis present

## 2015-07-29 DIAGNOSIS — K7200 Acute and subacute hepatic failure without coma: Secondary | ICD-10-CM | POA: Diagnosis present

## 2015-07-29 DIAGNOSIS — Z91010 Allergy to peanuts: Secondary | ICD-10-CM | POA: Diagnosis not present

## 2015-07-29 DIAGNOSIS — R17 Unspecified jaundice: Secondary | ICD-10-CM | POA: Diagnosis present

## 2015-07-29 DIAGNOSIS — Z882 Allergy status to sulfonamides status: Secondary | ICD-10-CM

## 2015-07-29 DIAGNOSIS — D61818 Other pancytopenia: Secondary | ICD-10-CM | POA: Diagnosis present

## 2015-07-29 DIAGNOSIS — F329 Major depressive disorder, single episode, unspecified: Secondary | ICD-10-CM | POA: Diagnosis present

## 2015-07-29 DIAGNOSIS — D689 Coagulation defect, unspecified: Secondary | ICD-10-CM | POA: Diagnosis present

## 2015-07-29 DIAGNOSIS — R6520 Severe sepsis without septic shock: Secondary | ICD-10-CM | POA: Diagnosis present

## 2015-07-29 DIAGNOSIS — Z66 Do not resuscitate: Secondary | ICD-10-CM | POA: Diagnosis present

## 2015-07-29 DIAGNOSIS — K921 Melena: Secondary | ICD-10-CM | POA: Diagnosis not present

## 2015-07-29 DIAGNOSIS — M8580 Other specified disorders of bone density and structure, unspecified site: Secondary | ICD-10-CM | POA: Diagnosis present

## 2015-07-29 DIAGNOSIS — N179 Acute kidney failure, unspecified: Secondary | ICD-10-CM | POA: Insufficient documentation

## 2015-07-29 DIAGNOSIS — K7581 Nonalcoholic steatohepatitis (NASH): Secondary | ICD-10-CM | POA: Diagnosis present

## 2015-07-29 DIAGNOSIS — N178 Other acute kidney failure: Secondary | ICD-10-CM | POA: Diagnosis not present

## 2015-07-29 DIAGNOSIS — Z79899 Other long term (current) drug therapy: Secondary | ICD-10-CM | POA: Diagnosis not present

## 2015-07-29 DIAGNOSIS — Z881 Allergy status to other antibiotic agents status: Secondary | ICD-10-CM

## 2015-07-29 DIAGNOSIS — N189 Chronic kidney disease, unspecified: Secondary | ICD-10-CM | POA: Diagnosis present

## 2015-07-29 DIAGNOSIS — K7210 Chronic hepatic failure without coma: Secondary | ICD-10-CM | POA: Diagnosis present

## 2015-07-29 DIAGNOSIS — K766 Portal hypertension: Secondary | ICD-10-CM | POA: Diagnosis present

## 2015-07-29 DIAGNOSIS — I129 Hypertensive chronic kidney disease with stage 1 through stage 4 chronic kidney disease, or unspecified chronic kidney disease: Secondary | ICD-10-CM | POA: Diagnosis present

## 2015-07-29 DIAGNOSIS — Z888 Allergy status to other drugs, medicaments and biological substances status: Secondary | ICD-10-CM | POA: Diagnosis not present

## 2015-07-29 DIAGNOSIS — K649 Unspecified hemorrhoids: Secondary | ICD-10-CM | POA: Diagnosis present

## 2015-07-29 DIAGNOSIS — R188 Other ascites: Secondary | ICD-10-CM | POA: Diagnosis present

## 2015-07-29 DIAGNOSIS — E876 Hypokalemia: Secondary | ICD-10-CM | POA: Diagnosis present

## 2015-07-29 DIAGNOSIS — E860 Dehydration: Secondary | ICD-10-CM | POA: Diagnosis present

## 2015-07-29 DIAGNOSIS — E861 Hypovolemia: Secondary | ICD-10-CM | POA: Diagnosis present

## 2015-07-29 DIAGNOSIS — K7460 Unspecified cirrhosis of liver: Secondary | ICD-10-CM

## 2015-07-29 DIAGNOSIS — D731 Hypersplenism: Secondary | ICD-10-CM | POA: Diagnosis present

## 2015-07-29 DIAGNOSIS — M069 Rheumatoid arthritis, unspecified: Secondary | ICD-10-CM | POA: Diagnosis present

## 2015-07-29 DIAGNOSIS — N390 Urinary tract infection, site not specified: Secondary | ICD-10-CM | POA: Diagnosis present

## 2015-07-29 DIAGNOSIS — Z8249 Family history of ischemic heart disease and other diseases of the circulatory system: Secondary | ICD-10-CM

## 2015-07-29 DIAGNOSIS — N19 Unspecified kidney failure: Secondary | ICD-10-CM

## 2015-07-29 DIAGNOSIS — Z6841 Body Mass Index (BMI) 40.0 and over, adult: Secondary | ICD-10-CM | POA: Diagnosis not present

## 2015-07-29 DIAGNOSIS — I10 Essential (primary) hypertension: Secondary | ICD-10-CM | POA: Diagnosis present

## 2015-07-29 DIAGNOSIS — J986 Disorders of diaphragm: Secondary | ICD-10-CM

## 2015-07-29 DIAGNOSIS — I950 Idiopathic hypotension: Secondary | ICD-10-CM | POA: Diagnosis not present

## 2015-07-29 DIAGNOSIS — R161 Splenomegaly, not elsewhere classified: Secondary | ICD-10-CM | POA: Diagnosis present

## 2015-07-29 DIAGNOSIS — M549 Dorsalgia, unspecified: Secondary | ICD-10-CM | POA: Diagnosis present

## 2015-07-29 DIAGNOSIS — Z886 Allergy status to analgesic agent status: Secondary | ICD-10-CM

## 2015-07-29 DIAGNOSIS — E8809 Other disorders of plasma-protein metabolism, not elsewhere classified: Secondary | ICD-10-CM | POA: Diagnosis present

## 2015-07-29 DIAGNOSIS — G8929 Other chronic pain: Secondary | ICD-10-CM | POA: Diagnosis present

## 2015-07-29 LAB — CBC
HCT: 29.8 % — ABNORMAL LOW (ref 36.0–46.0)
Hemoglobin: 9.7 g/dL — ABNORMAL LOW (ref 12.0–15.0)
MCH: 33.7 pg (ref 26.0–34.0)
MCHC: 32.6 g/dL (ref 30.0–36.0)
MCV: 103.5 fL — ABNORMAL HIGH (ref 78.0–100.0)
PLATELETS: 74 10*3/uL — AB (ref 150–400)
RBC: 2.88 MIL/uL — AB (ref 3.87–5.11)
RDW: 17 % — ABNORMAL HIGH (ref 11.5–15.5)
WBC: 5.2 10*3/uL (ref 4.0–10.5)

## 2015-07-29 LAB — URINALYSIS, ROUTINE W REFLEX MICROSCOPIC
Glucose, UA: NEGATIVE mg/dL
KETONES UR: 15 mg/dL — AB
NITRITE: POSITIVE — AB
Protein, ur: 100 mg/dL — AB
SPECIFIC GRAVITY, URINE: 1.027 (ref 1.005–1.030)
pH: 5 (ref 5.0–8.0)

## 2015-07-29 LAB — BASIC METABOLIC PANEL
Anion gap: 10 (ref 5–15)
BUN: 14 mg/dL (ref 6–20)
CALCIUM: 8.4 mg/dL — AB (ref 8.9–10.3)
CO2: 25 mmol/L (ref 22–32)
CREATININE: 2.79 mg/dL — AB (ref 0.44–1.00)
Chloride: 99 mmol/L — ABNORMAL LOW (ref 101–111)
GFR, EST AFRICAN AMERICAN: 19 mL/min — AB (ref >60)
GFR, EST NON AFRICAN AMERICAN: 17 mL/min — AB (ref >60)
Glucose, Bld: 94 mg/dL (ref 65–99)
Potassium: 3.7 mmol/L (ref 3.5–5.1)
SODIUM: 134 mmol/L — AB (ref 135–145)

## 2015-07-29 LAB — AMMONIA: AMMONIA: 69 umol/L — AB (ref 9–35)

## 2015-07-29 LAB — HEPATIC FUNCTION PANEL
ALT: 26 U/L (ref 14–54)
AST: 56 U/L — AB (ref 15–41)
Albumin: 2.1 g/dL — ABNORMAL LOW (ref 3.5–5.0)
Alkaline Phosphatase: 112 U/L (ref 38–126)
BILIRUBIN DIRECT: 3.8 mg/dL — AB (ref 0.1–0.5)
BILIRUBIN TOTAL: 9.5 mg/dL — AB (ref 0.3–1.2)
Indirect Bilirubin: 5.7 mg/dL — ABNORMAL HIGH (ref 0.3–0.9)
Total Protein: 6.1 g/dL — ABNORMAL LOW (ref 6.5–8.1)

## 2015-07-29 LAB — PROTIME-INR
INR: 3.16 — ABNORMAL HIGH (ref 0.00–1.49)
PROTHROMBIN TIME: 30.9 s — AB (ref 11.6–15.2)

## 2015-07-29 LAB — URINE MICROSCOPIC-ADD ON

## 2015-07-29 LAB — BRAIN NATRIURETIC PEPTIDE: B Natriuretic Peptide: 109.7 pg/mL — ABNORMAL HIGH (ref 0.0–100.0)

## 2015-07-29 LAB — APTT: aPTT: 58 seconds — ABNORMAL HIGH (ref 24–37)

## 2015-07-29 MED ORDER — VANCOMYCIN HCL IN DEXTROSE 1-5 GM/200ML-% IV SOLN
1000.0000 mg | Freq: Once | INTRAVENOUS | Status: AC
  Administered 2015-07-29: 1000 mg via INTRAVENOUS
  Filled 2015-07-29: qty 200

## 2015-07-29 MED ORDER — ALBUTEROL SULFATE (2.5 MG/3ML) 0.083% IN NEBU: 3.0000 mL | INHALATION_SOLUTION | Freq: Four times a day (QID) | RESPIRATORY_TRACT | Status: DC | PRN

## 2015-07-29 MED ORDER — CHLORHEXIDINE GLUCONATE CLOTH 2 % EX PADS: 6.0000 | MEDICATED_PAD | Freq: Every day | CUTANEOUS | Status: DC

## 2015-07-29 MED ORDER — TRIAMCINOLONE ACETONIDE 0.1 % EX CREA
TOPICAL_CREAM | Freq: Two times a day (BID) | CUTANEOUS | Status: DC
  Administered 2015-07-29: 1 via TOPICAL
  Administered 2015-07-30: 11:00:00 via TOPICAL
  Administered 2015-07-30 – 2015-07-31 (×2): 1 via TOPICAL
  Filled 2015-07-29 (×2): qty 15

## 2015-07-29 MED ORDER — DIPHENHYDRAMINE HCL 50 MG/ML IJ SOLN
25.0000 mg | Freq: Four times a day (QID) | INTRAMUSCULAR | Status: DC | PRN
  Administered 2015-07-30: 25 mg via INTRAVENOUS
  Filled 2015-07-29: qty 1

## 2015-07-29 MED ORDER — VITAMIN D 1000 UNITS PO TABS
1000.0000 [IU] | ORAL_TABLET | Freq: Every day | ORAL | Status: DC
  Administered 2015-07-30 – 2015-07-31 (×2): 1000 [IU] via ORAL
  Filled 2015-07-29 (×2): qty 1

## 2015-07-29 MED ORDER — LEVOTHYROXINE SODIUM 50 MCG PO TABS
50.0000 ug | ORAL_TABLET | Freq: Every day | ORAL | Status: DC
  Administered 2015-07-30 – 2015-07-31 (×2): 50 ug via ORAL
  Filled 2015-07-29 (×2): qty 1

## 2015-07-29 MED ORDER — LACTULOSE 10 GM/15ML PO SOLN
10.0000 g | Freq: Two times a day (BID) | ORAL | Status: DC
  Administered 2015-07-29 – 2015-07-31 (×4): 10 g via ORAL
  Filled 2015-07-29 (×4): qty 15

## 2015-07-29 MED ORDER — FENTANYL 12 MCG/HR TD PT72: 12.5000 ug | MEDICATED_PATCH | TRANSDERMAL | Status: DC

## 2015-07-29 MED ORDER — ALBUMIN HUMAN 25 % IV SOLN
25.0000 g | Freq: Four times a day (QID) | INTRAVENOUS | Status: DC
  Administered 2015-07-30 (×2): 25 g via INTRAVENOUS
  Filled 2015-07-29 (×2): qty 50
  Filled 2015-07-29: qty 100

## 2015-07-29 MED ORDER — RISAQUAD PO CAPS
1.0000 | ORAL_CAPSULE | Freq: Every day | ORAL | Status: DC
  Administered 2015-07-30 – 2015-07-31 (×2): 1 via ORAL
  Filled 2015-07-29 (×2): qty 1

## 2015-07-29 MED ORDER — LORATADINE 10 MG PO TABS
10.0000 mg | ORAL_TABLET | Freq: Every day | ORAL | Status: DC
  Administered 2015-07-30 – 2015-07-31 (×2): 10 mg via ORAL
  Filled 2015-07-29 (×2): qty 1

## 2015-07-29 MED ORDER — LORAZEPAM 1 MG PO TABS
1.0000 mg | ORAL_TABLET | Freq: Once | ORAL | Status: AC
  Administered 2015-07-29: 1 mg via ORAL
  Filled 2015-07-29: qty 1

## 2015-07-29 MED ORDER — DEXTROSE 5 % IV SOLN
500.0000 mg | Freq: Three times a day (TID) | INTRAVENOUS | Status: DC
  Administered 2015-07-29 – 2015-07-30 (×2): 500 mg via INTRAVENOUS
  Filled 2015-07-29 (×4): qty 0.5

## 2015-07-29 MED ORDER — VITAMIN B-12 1000 MCG PO TABS
1000.0000 ug | ORAL_TABLET | Freq: Every day | ORAL | Status: DC
  Administered 2015-07-30 – 2015-07-31 (×2): 1000 ug via ORAL
  Filled 2015-07-29 (×2): qty 1

## 2015-07-29 MED ORDER — FENTANYL CITRATE (PF) 100 MCG/2ML IJ SOLN: 12.5000 ug | INTRAMUSCULAR | Status: DC | PRN

## 2015-07-29 MED ORDER — MUPIROCIN 2 % EX OINT: 1.0000 "application " | TOPICAL_OINTMENT | Freq: Two times a day (BID) | CUTANEOUS | Status: DC

## 2015-07-29 MED ORDER — SODIUM CHLORIDE 0.9 % IV BOLUS (SEPSIS)
1000.0000 mL | Freq: Once | INTRAVENOUS | Status: AC
  Administered 2015-07-29: 1000 mL via INTRAVENOUS

## 2015-07-29 MED ORDER — ONDANSETRON HCL 4 MG/2ML IJ SOLN: 4.0000 mg | Freq: Three times a day (TID) | INTRAMUSCULAR | Status: AC | PRN

## 2015-07-29 NOTE — Progress Notes (Signed)
Pharmacy Antibiotic Note  Lori Mills is a 65 y.o. female admitted on 07/29/2015 with UTI.  Pharmacy has been consulted for aztreonam dosing.  Pt is obese with AKI.  Given 1 dose of Vanc in ED.  Plan: Aztreonam 500 mg IV q8h.     Temp (24hrs), Avg:97.8 F (36.6 C), Min:97.6 F (36.4 C), Max:98.1 F (36.7 C)   Recent Labs Lab 07/29/15 1330  WBC 5.2  CREATININE 2.79*    CrCl cannot be calculated (Unknown ideal weight.).    Allergies  Allergen Reactions  . Cephalexin Itching    Can take Augmentin  . Dilaudid [Hydromorphone Hcl]     Feeling depressed on it  . Doxycycline Photosensitivity  . Enalapril Maleate   . Hydrochlorothiazide W-Triamterene   . Ibuprofen     REACTION: pain in abd  . Oxycodone     rash  . Penicillins   . Spironolactone   . Tramadol     n/v  . Bactrim [Sulfamethoxazole-Trimethoprim] Rash    Antimicrobials this admission: 2/23 aztreonam >>  2/23 vanc x 1  Dose adjustments this admission: -  Microbiology results: 2/23 BCx: collected 2/23 UCx: collected   Thank you for allowing pharmacy to be a part of this patient's care.  Clance Boll 07/29/2015 9:11 PM

## 2015-07-29 NOTE — ED Notes (Signed)
Pt states she has had reduced urine output for the past 2-3 days and pain in her lower back since last night. Pt has hx of NASH and was told her creatine was 2.56 on 2/1. Pt denies n/v/d.

## 2015-07-29 NOTE — Telephone Encounter (Signed)
Patient called and stated fentnyl patch is not working well to control pain---also she is retaining large amts of fluid----i looked at patients last labs for kidney and liver function---i have also checked with dr Posey Rea and we both agree that patient needs to be seen by ED ----advised patient to go to ED---i have called ahead to charge nurse at [facility] ED to advise that patient is on way (patient requested [facility] ED)

## 2015-07-29 NOTE — ED Provider Notes (Signed)
CSN: [id number]     Arrival date & time 07/29/15  1239 History   First MD Initiated Contact with Patient 07/29/15 1618     Chief Complaint  Patient presents with  . Dysuria  . Back Pain     (Consider location/radiation/quality/duration/timing/severity/associated sxs/prior Treatment) Patient is a 65 y.o. female presenting with dysuria and back pain. The history is provided by the patient. No language interpreter was used.  Dysuria Pain quality:  Aching Pain severity:  Moderate Onset quality:  Gradual Duration:  2 days Timing:  Constant Progression:  Worsening Chronicity:  New Recent urinary tract infections: yes   Relieved by:  Nothing Worsened by:  Nothing tried Urinary symptoms: no frequent urination   Associated symptoms: nausea   Associated symptoms: no abdominal pain   Risk factors: renal disease   Back Pain Associated symptoms: dysuria   Associated symptoms: no abdominal pain   Pt also reports her MD stopped her lasix and she has had increased swelling to her lags and her abdomen.   Past Medical History  Diagnosis Date  . Psoriasis     Dr Yetta Barre  . Psoriatic arthritis (HCC)     Dr Titus Dubin  . Depression   . HTN (hypertension)   . LBP (low back pain)   . Osteoarthritis   . Osteopenia   . Obesity   . Allergic rhinitis   . 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 Hepatomegaly     [place]/2011 Dr Julieta Gutting [facility]-had MRI  . Cirrhosis of liver (HCC)   . Thrombocytopenia due to hypersplenism   . Splenomegaly 02/11/2015  . Leukopenia 02/11/2015   Past Surgical History  Procedure Laterality Date  . None to report     Family History  Problem Relation Age of Onset  . Arthritis    . Hypertension    . Hypertension Mother   . Vasculitis Mother     temporal arteritis   Social History  Substance Use Topics  . Smoking status: Never Smoker   . Smokeless tobacco: None  . Alcohol Use: No   OB History    No data available     Review of Systems   Gastrointestinal: Positive for nausea. Negative for abdominal pain.  Genitourinary: Positive for dysuria.  Musculoskeletal: Positive for back pain.  All other systems reviewed and are negative.     Allergies  Cephalexin; Dilaudid; Doxycycline; Enalapril maleate; Hydrochlorothiazide w-triamterene; Ibuprofen; Oxycodone; Penicillins; Spironolactone; Tramadol; and Bactrim  Home Medications   Prior to Admission medications   Medication Sig Start Date End Date Taking? Authorizing Provider  albuterol (PROVENTIL HFA;VENTOLIN HFA) 108 (90 BASE) MCG/ACT inhaler Inhale 1-2 puffs into the lungs every 6 (six) hours as needed for wheezing or shortness of breath. 03/28/15  Yes Samantha Tripp Dowless, PA-C  Cholecalciferol (VITAMIN D3) 1000 UNITS tablet Take 1,000 Units by mouth daily.     Yes Historical Provider, MD  fentaNYL (DURAGESIC) 12 MCG/HR Place 1 patch (12.5 mcg total) onto the skin every 3 (three) days. 07/12/15  Yes Aleksei Plotnikov V, MD  Lactobacillus (ACIDOPHILUS) CAPS Take 1 capsule by mouth daily.    Yes Historical Provider, MD  loratadine (CLARITIN) 10 MG tablet Take 1 tablet (10 mg total) by mouth daily. For allergies 07/30/14  Yes Aleksei Plotnikov V, MD  milk thistle 175 MG tablet Take 175 mg by mouth daily as needed (cleanse liver).   Yes Historical Provider, MD  triamcinolone cream (KENALOG) 0.1 % Apply topically  2 (two) times daily. 10/08/13  Yes Aleksei Plotnikov V, MD  vitamin B-12 (CYANOCOBALAMIN) 1000 MCG tablet Take 1,000 mcg by mouth daily.     Yes Historical Provider, MD  furosemide (LASIX) 40 MG tablet take 1 tablet by mouth if needed (WATER PILL) Patient not taking: Reported on 07/29/2015 04/06/15   Tresa Garter, MD  levothyroxine (SYNTHROID, LEVOTHROID) 50 MCG tablet take 1 tablet by mouth once daily 11/18/14   Tresa Garter, MD  losartan (COZAAR) 100 MG tablet take 1 tablet by mouth once daily Patient not taking: Reported on 07/07/2015 11/18/14   Macarthur Critchley Plotnikov  V, MD  potassium chloride (KLOR-CON 10) 10 MEQ tablet Take 1 tablet (10 mEq total) by mouth daily. Patient not taking: Reported on 07/29/2015 09/16/14   Aleksei Plotnikov V, MD   BP 123/42 mmHg  Pulse 111  Temp(Src) 97.6 F (36.4 C) (Oral)  Resp 17  SpO2 100% Physical Exam  Constitutional: She is oriented to person, place, and time. She appears well-developed and well-nourished.  HENT:  Head: Normocephalic.  Eyes: EOM are normal.  Neck: Normal range of motion.  Cardiovascular: Normal rate.   Pulmonary/Chest: Effort normal.  Abdominal: Soft. She exhibits no distension.  Musculoskeletal: Normal range of motion.  Neurological: She is alert and oriented to person, place, and time.  Skin: Skin is warm.  Psychiatric: She has a normal mood and affect.  Nursing note and vitals reviewed.   ED Course  Procedures (including critical care time) Labs Review Labs Reviewed  URINALYSIS, ROUTINE W REFLEX MICROSCOPIC (NOT AT [facility]) - Abnormal; Notable for the following:    Color, Urine RED (*)    APPearance TURBID (*)    Hgb urine dipstick MODERATE (*)    Bilirubin Urine LARGE (*)    Ketones, ur 15 (*)    Protein, ur 100 (*)    Nitrite POSITIVE (*)    Leukocytes, UA MODERATE (*)    All other components within normal limits  BASIC METABOLIC PANEL - Abnormal; Notable for the following:    Sodium 134 (*)    Chloride 99 (*)    Creatinine, Ser 2.79 (*)    Calcium 8.4 (*)    GFR calc non Af Amer 17 (*)    GFR calc Af Amer 19 (*)    All other components within normal limits  CBC - Abnormal; Notable for the following:    RBC 2.88 (*)    Hemoglobin 9.7 (*)    HCT 29.8 (*)    MCV 103.5 (*)    RDW 17.0 (*)    Platelets 74 (*)    All other components within normal limits  URINE MICROSCOPIC-ADD ON - Abnormal; Notable for the following:    Squamous Epithelial / LPF 0-5 (*)    Bacteria, UA MANY (*)    All other components within normal limits  HEPATIC FUNCTION PANEL - Abnormal; Notable for  the following:    Total Protein 6.1 (*)    Albumin 2.1 (*)    AST 56 (*)    Total Bilirubin 9.5 (*)    Bilirubin, Direct 3.8 (*)    Indirect Bilirubin 5.7 (*)    All other components within normal limits  BRAIN NATRIURETIC PEPTIDE - Abnormal; Notable for the following:    B Natriuretic Peptide 109.7 (*)    All other components within normal limits    Imaging Review No results found. I have personally reviewed and evaluated these images and lab results as part of my  medical decision-making.   EKG Interpretation None      MDM Pt has a uti,   Pt has elevated liver functions,  Pt aappears to have ascites.    Final diagnoses:  Ascites  UTI (lower urinary tract infection)  Renal failure  Cirrhosis of liver with ascites, unspecified hepatic cirrhosis type (HCC)     I spoke to the hospitalist who will admit.   Pt given vancomycin.     Lonia Skinner [place], PA-C 07/29/15 2055  Tilden Fossa, MD 07/31/15 [phone]

## 2015-07-29 NOTE — H&P (Signed)
[facility] History and Physical  Lori Mills ZOX:[id number] DOB: 1950-11-06 DOA: 07/29/2015  Referring physician: Elson Areas, PA-C PCP: Sonda Primes, MD   Chief Complaint: Decreased urine output.  HPI: Lori Mills is a 65 y.o. 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  Per patient, since about a week ago she has noticed that her edema has increased. She reports that she was told to stop her diuretic due to an elevation of creatinine. Her creatinine was normal back in October and now is 2.79. She complains of low-grade, chills and fatigue. She denies headache, productive cough, chest pain, dizziness, diaphoresis, but complains of increased dyspnea due to anasarca. She has had mild abdominal pain and nausea, but denies emesis, diarrhea, constipation, melena or hematochezia.   When seen, the patient was hypotensive, in no acute distress, but look acutely ill. Workup in the ER is significant for anemia with hemoglobin of 9.7, INR 3.19, hyperammonemia of 69, hypoalbuminemia, elevated creatinine at 2.79, and urine analysis. Findings consistent with UTI.   Review of Systems:  Constitutional:  Positive for Fevers, chills, fatigue.  No weight loss, night sweats,  HEENT:  No headaches, Difficulty swallowing,Tooth/dental problems,Sore throat,  No sneezing, itching, ear ache, nasal congestion, post nasal drip,  Cardio-vascular:  Positive for swelling in lower extremities, anasarca, Orthopnea No chest pain, PND,dizziness, palpitations  GI:  As above mentioned. Resp:  Positive dyspnea, no cough, no wheezing, no  hemoptysis. Skin:  no rash or lesions.  GU:  Positive dysuria, decreased urine output. Musculoskeletal:  Positive arthralgias and mild overall decreased range of motion. No back pain.  Psych:  No change in mood or affect. No depression or anxiety. No memory loss.   Past Medical History  Diagnosis Date  . Psoriasis     Dr Yetta Barre  . Psoriatic arthritis (HCC)     Dr Titus Dubin  . Depression   . HTN (hypertension)   . LBP (low back pain)   . Osteoarthritis   . Osteopenia   . Obesity   . Allergic rhinitis   . 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 Hepatomegaly     [place]/2011 Dr Julieta Gutting [facility] [place]-had MRI  . Cirrhosis of liver (HCC)   . Thrombocytopenia due to hypersplenism   . Splenomegaly 02/11/2015  . Leukopenia 02/11/2015   Past Surgical History  Procedure Laterality Date  . None to report     Social History:  reports that she has never smoked. She does not have any smokeless tobacco history on file. She reports that she does not drink alcohol or use illicit drugs.  Allergies  Allergen Reactions  . Cephalexin Itching    Can take Augmentin  . Dilaudid [Hydromorphone Hcl]     Feeling depressed on it  . Doxycycline Photosensitivity  . Enalapril Maleate   . Hydrochlorothiazide W-Triamterene   . Ibuprofen     REACTION: pain in abd  . Oxycodone     rash  . Penicillins   . Spironolactone   . Tramadol     n/v  . Bactrim [Sulfamethoxazole-Trimethoprim] Rash    Family History  Problem Relation Age of  Onset  . Arthritis    . Hypertension    . Hypertension Mother   . Vasculitis Mother     temporal arteritis    Prior to Admission medications   Medication Sig Start Date End Date Taking? Authorizing Provider  albuterol (PROVENTIL HFA;VENTOLIN HFA) 108 (90 BASE) MCG/ACT inhaler Inhale 1-2 puffs into the lungs every 6 (six) hours as needed for wheezing or shortness of breath. 03/28/15  Yes Samantha Tripp Dowless, PA-C  Cholecalciferol (VITAMIN D3) 1000  UNITS tablet Take 1,000 Units by mouth daily.     Yes Historical Provider, MD  fentaNYL (DURAGESIC) 12 MCG/HR Place 1 patch (12.5 mcg total) onto the skin every 3 (three) days. 07/12/15  Yes Aleksei Plotnikov V, MD  Lactobacillus (ACIDOPHILUS) CAPS Take 1 capsule by mouth daily.    Yes Historical Provider, MD  loratadine (CLARITIN) 10 MG tablet Take 1 tablet (10 mg total) by mouth daily. For allergies 07/30/14  Yes Aleksei Plotnikov V, MD  milk thistle 175 MG tablet Take 175 mg by mouth daily as needed (cleanse liver).   Yes Historical Provider, MD  triamcinolone cream (KENALOG) 0.1 % Apply topically 2 (two) times daily. 10/08/13  Yes Aleksei Plotnikov V, MD  vitamin B-12 (CYANOCOBALAMIN) 1000 MCG tablet Take 1,000 mcg by mouth daily.     Yes Historical Provider, MD  furosemide (LASIX) 40 MG tablet take 1 tablet by mouth if needed (WATER PILL) Patient not taking: Reported on 07/29/2015 04/06/15   Tresa Garter, MD  levothyroxine (SYNTHROID, LEVOTHROID) 50 MCG tablet take 1 tablet by mouth once daily 11/18/14   Tresa Garter, MD  losartan (COZAAR) 100 MG tablet take 1 tablet by mouth once daily Patient not taking: Reported on 07/07/2015 11/18/14   Macarthur Critchley Plotnikov V, MD  potassium chloride (KLOR-CON 10) 10 MEQ tablet Take 1 tablet (10 mEq total) by mouth daily. Patient not taking: Reported on 07/29/2015 09/16/14   Tresa Garter, MD   Physical Exam: Filed Vitals:   07/29/15 2100 07/29/15 2150 07/29/15 2200 07/29/15 2223  BP: 77/32  71/34   Pulse: 105  100   Temp:  98.1 F (36.7 C)    TempSrc:  Rectal    Resp: 21     Height:    5\' 1"  (1.549 m)  Weight:    128.4 kg (283 lb 1.1 oz)  SpO2: 94%  97%     Wt Readings from Last 3 Encounters:  07/29/15 128.4 kg (283 lb 1.1 oz)  07/07/15 124.739 kg (275 lb)  06/11/15 122.925 kg (271 lb)    General:  Appears calm and comfortable. Eyes: PERRL, normal lids, irises & conjunctiva, positive scleral icterus. ENT: grossly normal hearing, lips  & tongue Neck: no LAD, masses or thyromegaly Cardiovascular: RRR, no m/r/g. 4+ LE edema. Venous stasis changes on LE. Telemetry: SR, no arrhythmias  Respiratory: CTA bilaterally, no w/r/r. Normal respiratory effort. Abdomen: Obese, distended, BS+, soft, nontender. Skin: Jaundiced. Multiple areas of erythema and extremities and trunk. Musculoskeletal: grossly normal tone BUE/BLE Psychiatric: grossly normal mood and affect, speech fluent and appropriate Neurologic: Awake, alert, oriented 3, grossly non-focal.          Labs on Admission:  Basic Metabolic Panel:  Recent Labs Lab 07/29/15 1330  NA 134*  K 3.7  CL 99*  CO2 25  GLUCOSE 94  BUN 14  CREATININE 2.79*  CALCIUM 8.4*   Liver Function Tests:  Recent Labs Lab 07/29/15 1330  AST 56*  ALT 26  ALKPHOS 112  BILITOT 9.5*  PROT 6.1*  ALBUMIN 2.1*    Recent Labs Lab 07/29/15 2150  AMMONIA 69*   CBC:  Recent Labs Lab 07/29/15 1330  WBC 5.2  HGB 9.7*  HCT 29.8*  MCV 103.5*  PLT 74*    BNP (last 3 results)  Recent Labs  07/29/15 1330  BNP 109.7*     EKG: Independently reviewed Vent. rate 108 BPM PR interval 144 ms QRS duration 85 ms QT/QTc 336/450 ms P-R-T axes 73 23 1 Sinus tachycardia Low voltage, precordial leads Borderline T abnormalities, lateral leads  Assessment/Plan Principal Problem:   Sepsis secondary to UTI (HCC) Admit to a stepdown. Continue cardiac monitoring. Fluid challenge for hypotension with IV albumin every 6 hours 4 doses. Continue aztreonam and vancomycin. Follow-up blood cultures. Follow-up urine culture and sensitivity. Critical care was consulted due to hypotension and possible hepatorenal syndrome development. Consult IR tomorrow for possible paracenteses if blood pressure and vital signs allow it. Consider nephrology evaluation.  Active Problems:   Hypothyroidism Check TSH. Continue levothyroxine supplementation.    OBESITY, MORBID Per patient, this is  the reason she does not qualify for liver transplantation. Patient to consider lifestyle modifications as possible.    Essential hypertension Hold antihypertensives at the moment.    Acute on chronic renal failure Continue IV fluids.  Monitor input and output. Continue albumin supplementation.    Jaundice Benadryl as needed for pruritus.    Liver failure (HCC) Started lactulose twice a day. Monitor ammonia level as needed.    Anasarca Consult IR to in a.m. for possible paracentesis.   Critical care was consulted.   Code Status: Full code DVT Prophylaxis: Coagulopathy with lower extremity edema. Family Communication: Disposition Plan: Admit to a stepdown for close monitoring, IV antibiotic therapy, IV fluid challenge, IV albumin.   Time spent: Over 90 minutes are spent in the process of this admission.  Bobette Mo, M.D. [facility] Pager [phone].

## 2015-07-29 NOTE — ED Notes (Signed)
Pt c/o low back pain and decreased output x 2-3 days and increasing BLE swelling and lower abdominal swelling x 1 week.  Pt reports that she was recently told to stop taking her "fluid pill d/t elevated creatinine."

## 2015-07-29 NOTE — ED Notes (Signed)
Pt given water and Ginger Ale per MD.

## 2015-07-30 ENCOUNTER — Inpatient Hospital Stay (HOSPITAL_COMMUNITY): Payer: Medicare Other

## 2015-07-30 DIAGNOSIS — R17 Unspecified jaundice: Secondary | ICD-10-CM

## 2015-07-30 DIAGNOSIS — N390 Urinary tract infection, site not specified: Secondary | ICD-10-CM

## 2015-07-30 DIAGNOSIS — K7460 Unspecified cirrhosis of liver: Secondary | ICD-10-CM

## 2015-07-30 DIAGNOSIS — N189 Chronic kidney disease, unspecified: Secondary | ICD-10-CM

## 2015-07-30 DIAGNOSIS — R161 Splenomegaly, not elsewhere classified: Secondary | ICD-10-CM

## 2015-07-30 DIAGNOSIS — I959 Hypotension, unspecified: Secondary | ICD-10-CM

## 2015-07-30 DIAGNOSIS — I950 Idiopathic hypotension: Secondary | ICD-10-CM

## 2015-07-30 DIAGNOSIS — N178 Other acute kidney failure: Secondary | ICD-10-CM

## 2015-07-30 DIAGNOSIS — A419 Sepsis, unspecified organism: Principal | ICD-10-CM

## 2015-07-30 DIAGNOSIS — R601 Generalized edema: Secondary | ICD-10-CM

## 2015-07-30 DIAGNOSIS — R188 Other ascites: Secondary | ICD-10-CM | POA: Insufficient documentation

## 2015-07-30 DIAGNOSIS — N179 Acute kidney failure, unspecified: Secondary | ICD-10-CM

## 2015-07-30 DIAGNOSIS — K7200 Acute and subacute hepatic failure without coma: Secondary | ICD-10-CM

## 2015-07-30 DIAGNOSIS — D689 Coagulation defect, unspecified: Secondary | ICD-10-CM

## 2015-07-30 DIAGNOSIS — R6521 Severe sepsis with septic shock: Secondary | ICD-10-CM

## 2015-07-30 LAB — PROCALCITONIN: Procalcitonin: <0.1 ng/mL

## 2015-07-30 LAB — CBC WITH DIFFERENTIAL/PLATELET
BASOS ABS: 0 10*3/uL (ref 0.0–0.1)
Basophils Relative: 1 %
EOS ABS: 0.1 10*3/uL (ref 0.0–0.7)
Eosinophils Relative: 5 %
HCT: 20.2 % — ABNORMAL LOW (ref 36.0–46.0)
HEMOGLOBIN: 6.7 g/dL — AB (ref 12.0–15.0)
LYMPHS ABS: 0.4 10*3/uL — AB (ref 0.7–4.0)
LYMPHS PCT: 22 %
MCH: 34 pg (ref 26.0–34.0)
MCHC: 33.2 g/dL (ref 30.0–36.0)
MCV: 102.5 fL — ABNORMAL HIGH (ref 78.0–100.0)
MONO ABS: 0.2 10*3/uL (ref 0.1–1.0)
Monocytes Relative: 10 %
NEUTROS ABS: 1 10*3/uL — AB (ref 1.7–7.7)
Neutrophils Relative %: 62 %
PLATELETS: 34 10*3/uL — AB (ref 150–400)
RBC: 1.97 MIL/uL — ABNORMAL LOW (ref 3.87–5.11)
RDW: 17.2 % — AB (ref 11.5–15.5)
WBC: 1.7 10*3/uL — ABNORMAL LOW (ref 4.0–10.5)

## 2015-07-30 LAB — COMPREHENSIVE METABOLIC PANEL
ALBUMIN: 1.8 g/dL — AB (ref 3.5–5.0)
ALBUMIN: 1.9 g/dL — AB (ref 3.5–5.0)
ALBUMIN: 2.2 g/dL — AB (ref 3.5–5.0)
ALK PHOS: 88 U/L (ref 38–126)
ALK PHOS: 78 U/L (ref 38–126)
ALT: 21 U/L (ref 14–54)
ALT: 19 U/L (ref 14–54)
ALT: 17 U/L (ref 14–54)
ANION GAP: 9 (ref 5–15)
ANION GAP: 10 (ref 5–15)
AST: 44 U/L — ABNORMAL HIGH (ref 15–41)
AST: 39 U/L (ref 15–41)
AST: 36 U/L (ref 15–41)
Alkaline Phosphatase: 75 U/L (ref 38–126)
Anion gap: 9 (ref 5–15)
BILIRUBIN TOTAL: 9.5 mg/dL — AB (ref 0.3–1.2)
BILIRUBIN TOTAL: 8.9 mg/dL — AB (ref 0.3–1.2)
BILIRUBIN TOTAL: 8.9 mg/dL — AB (ref 0.3–1.2)
BUN: 16 mg/dL (ref 6–20)
BUN: 17 mg/dL (ref 6–20)
BUN: 18 mg/dL (ref 6–20)
CALCIUM: 8.2 mg/dL — AB (ref 8.9–10.3)
CALCIUM: 7.9 mg/dL — AB (ref 8.9–10.3)
CO2: 25 mmol/L (ref 22–32)
CO2: 25 mmol/L (ref 22–32)
CO2: 24 mmol/L (ref 22–32)
CREATININE: 2.89 mg/dL — AB (ref 0.44–1.00)
CREATININE: 2.91 mg/dL — AB (ref 0.44–1.00)
Calcium: 7.9 mg/dL — ABNORMAL LOW (ref 8.9–10.3)
Chloride: 100 mmol/L — ABNORMAL LOW (ref 101–111)
Chloride: 101 mmol/L (ref 101–111)
Chloride: 100 mmol/L — ABNORMAL LOW (ref 101–111)
Creatinine, Ser: 2.81 mg/dL — ABNORMAL HIGH (ref 0.44–1.00)
GFR calc Af Amer: 19 mL/min — ABNORMAL LOW (ref >60)
GFR calc non Af Amer: 16 mL/min — ABNORMAL LOW (ref >60)
GFR calc non Af Amer: 16 mL/min — ABNORMAL LOW (ref >60)
GFR, EST AFRICAN AMERICAN: 18 mL/min — AB (ref >60)
GFR, EST AFRICAN AMERICAN: 18 mL/min — AB (ref >60)
GFR, EST NON AFRICAN AMERICAN: 16 mL/min — AB (ref >60)
GLUCOSE: 102 mg/dL — AB (ref 65–99)
GLUCOSE: 99 mg/dL (ref 65–99)
Glucose, Bld: 81 mg/dL (ref 65–99)
POTASSIUM: 3.6 mmol/L (ref 3.5–5.1)
Potassium: 3.4 mmol/L — ABNORMAL LOW (ref 3.5–5.1)
Potassium: 3.4 mmol/L — ABNORMAL LOW (ref 3.5–5.1)
SODIUM: 134 mmol/L — AB (ref 135–145)
Sodium: 135 mmol/L (ref 135–145)
Sodium: 134 mmol/L — ABNORMAL LOW (ref 135–145)
TOTAL PROTEIN: 4.8 g/dL — AB (ref 6.5–8.1)
TOTAL PROTEIN: 5 g/dL — AB (ref 6.5–8.1)
Total Protein: 5 g/dL — ABNORMAL LOW (ref 6.5–8.1)

## 2015-07-30 LAB — SODIUM, URINE, RANDOM: Sodium, Ur: 16 mmol/L

## 2015-07-30 LAB — CBC
HEMATOCRIT: 19.6 % — AB (ref 36.0–46.0)
Hemoglobin: 6.5 g/dL — CL (ref 12.0–15.0)
MCH: 34 pg (ref 26.0–34.0)
MCHC: 33.2 g/dL (ref 30.0–36.0)
MCV: 102.6 fL — AB (ref 78.0–100.0)
PLATELETS: 32 10*3/uL — AB (ref 150–400)
RBC: 1.91 MIL/uL — ABNORMAL LOW (ref 3.87–5.11)
RDW: 17.1 % — AB (ref 11.5–15.5)
WBC: 1.3 10*3/uL — CL (ref 4.0–10.5)

## 2015-07-30 LAB — CREATININE, URINE, RANDOM: Creatinine, Urine: 58.07 mg/dL

## 2015-07-30 LAB — LACTIC ACID, PLASMA
LACTIC ACID, VENOUS: 3 mmol/L — AB (ref 0.5–2.0)
Lactic Acid, Venous: 2.1 mmol/L (ref 0.5–2.0)

## 2015-07-30 LAB — TSH: TSH: 1.952 u[IU]/mL (ref 0.350–4.500)

## 2015-07-30 LAB — ABO/RH: ABO/RH(D): O POS

## 2015-07-30 LAB — MRSA PCR SCREENING: MRSA by PCR: NEGATIVE

## 2015-07-30 LAB — PREPARE RBC (CROSSMATCH)

## 2015-07-30 MED ORDER — ROPINIROLE HCL 0.5 MG PO TABS
0.5000 mg | ORAL_TABLET | Freq: Every day | ORAL | Status: DC
  Administered 2015-07-31: 0.5 mg via ORAL
  Filled 2015-07-30 (×2): qty 1

## 2015-07-30 MED ORDER — MIDODRINE HCL 5 MG PO TABS
10.0000 mg | ORAL_TABLET | Freq: Three times a day (TID) | ORAL | Status: DC
  Administered 2015-07-31: 10 mg via ORAL
  Filled 2015-07-30 (×6): qty 2

## 2015-07-30 MED ORDER — VITAMIN K1 10 MG/ML IJ SOLN
10.0000 mg | Freq: Every day | INTRAMUSCULAR | Status: DC
  Administered 2015-07-30 – 2015-07-31 (×2): 10 mg via SUBCUTANEOUS
  Filled 2015-07-30 (×2): qty 1

## 2015-07-30 MED ORDER — POTASSIUM CHLORIDE 10 MEQ/100ML IV SOLN
10.0000 meq | INTRAVENOUS | Status: AC
  Administered 2015-07-30 (×4): 10 meq via INTRAVENOUS
  Filled 2015-07-30 (×4): qty 100

## 2015-07-30 MED ORDER — SODIUM CHLORIDE 0.9 % IV SOLN: Freq: Once | INTRAVENOUS | Status: DC

## 2015-07-30 MED ORDER — DIPHENHYDRAMINE HCL 50 MG/ML IJ SOLN
25.0000 mg | Freq: Once | INTRAMUSCULAR | Status: AC
  Administered 2015-07-30: 25 mg via INTRAVENOUS
  Filled 2015-07-30: qty 1

## 2015-07-30 MED ORDER — LORAZEPAM 0.5 MG PO TABS
0.5000 mg | ORAL_TABLET | Freq: Once | ORAL | Status: AC
  Administered 2015-07-30: 0.5 mg via ORAL
  Filled 2015-07-30: qty 1

## 2015-07-30 MED ORDER — CIPROFLOXACIN IN D5W 400 MG/200ML IV SOLN
400.0000 mg | Freq: Every day | INTRAVENOUS | Status: DC
  Administered 2015-07-30 (×2): 400 mg via INTRAVENOUS
  Filled 2015-07-30 (×2): qty 200

## 2015-07-30 MED ORDER — ENSURE ENLIVE PO LIQD
237.0000 mL | Freq: Two times a day (BID) | ORAL | Status: DC
  Administered 2015-07-30: 237 mL via ORAL

## 2015-07-30 MED ORDER — SODIUM CHLORIDE 0.9 % IV BOLUS (SEPSIS)
500.0000 mL | Freq: Once | INTRAVENOUS | Status: AC
  Administered 2015-07-30: 500 mL via INTRAVENOUS

## 2015-07-30 MED ORDER — ALBUMIN HUMAN 25 % IV SOLN
25.0000 g | Freq: Four times a day (QID) | INTRAVENOUS | Status: DC
  Administered 2015-07-30 – 2015-07-31 (×4): 25 g via INTRAVENOUS
  Filled 2015-07-30: qty 50
  Filled 2015-07-30: qty 100
  Filled 2015-07-30 (×5): qty 50

## 2015-07-30 NOTE — Progress Notes (Signed)
CRITICAL VALUE ALERT  Critical value received:  Lactic Acid 3.0  Date of notification:  07/30/15  Time of notification:  01:00  Critical value read back:Yes.    Nurse who received alert:  Edison Nasuti  MD notified (1st page):  Joneen Roach  Time of first page:  01:09  MD notified (2nd page):  Time of second page: N/A  Responding MD:  Joneen Roach (In person)  Time MD responded:  01:09

## 2015-07-30 NOTE — Progress Notes (Addendum)
[facility] Progress Note   Lori Mills  NFA:[id number]  DOB: [birth year]  DOA: 07/29/2015 PCP: Sonda Primes, MD  Brief narrative: Lori Mills is a 65 y.o. female with a history of psoriasis, psoriatic arthritis, cirrhosis of the liver secondary to [facility], hypertension, depression, morbid obesity, chronic lower back pain who presents to the hospital with decreased urine output for 2 days. She states that she was previously on Lasix but her PCP discontinued this because her kidneys were looking worse. She states that she has developed progressive swelling in her legs and her abdomen and it got to a point where her legs were too heavy for her to lift. In the ER she was found to have hypotension, lactic acidosis and acute renal failure. She was also noted to have a UA that was positive for UTI and she was therefore admitted.   Subjective: She states that she is urinating well now. No complaints of dysuria or suprapubic pain. No fevers. She states that she became severely depressed after her husband passed away last 2023-02-05 and has had a very poor by mouth intake since then. She has no complaints of cough and congestion and sore throat nausea vomiting or diarrhea.  Assessment/Plan: Principal Problem:   Hypotension/lactic acidosis/AKI -Suspect secondary to dehydration due to severe third spacing and poor oral intake- she may also have sepsis secondary to a UTI -Has improved with IV fluids and antibiotics  Active Problems: Anasarca/hypoalbuminemia due to severe protein calorie malnutrition -Has received albumin infusions -We'll continue to hold Lasix due to acute renal failure -Have talked to her extensively about connection between poor oral intake, hypoalbuminemia and anasarca -Added ensure and have encouraged her to increase her protein intake in her diet  UTI - cont Cipro and f/u cultures  Acute renal failure -Creatinine was 0.6 ON October 2016 and has jumped to 2.56  since 07/07/2015 -Suspect this is secondary to severe third spacing/anasarca - possible hepatorenal syndrome- will start Midodrine and continue albumin for another 24 hrs -Agree with holding Lasix for now -Obtain urine sodium and creatinine  Pancytopenia -Likely secondary to hypersplenism -Also admits that her hemorrhoids have been bleeding for quite a while now which likely is a factor in the anemia -We'll obtain an anemia panel and replace any deficiencies -1 unit of packed red blood cells has been ordered as hemoglobin has dropped to 6.5 with IV hydration -As she had a significant amount of bloody stool today, I have ordered a unit of platelets to be transfused in order to get her platelet level greater than 50,000  Hemorrhoidal bleed - large amount of blood noted this AM - start Anusol    Liver cirrhosis (NASH) with resultant portal hypertension, splenomegaly, ascites, pancytopenia, coagulopathy, jaundice -Appears to be progressive as bilirubin has risen to the 8-9 range and her INR is now elevated at 3.16 -She also states that her abdomen is more swollen than before which is likely secondary to ascites - She has been declined for liver transplant due to her morbid obesity -cont supportive care  Elevated INR -I have ordered daily vitamin K but I suspect this is secondary to severe cirrhosis  Psoriasis and Psoriatic arthritis - Kenalog cream  Antibiotics: Anti-infectives    Start     Dose/Rate Route Frequency Ordered Stop   07/30/15 0130  ciprofloxacin (CIPRO) IVPB 400 mg     400 mg 200 mL/hr over 60 Minutes Intravenous Daily at bedtime 07/30/15 0127     07/29/15 2200  aztreonam (  AZACTAM) 500 mg in dextrose 5 % 50 mL IVPB     500 mg 100 mL/hr over 30 Minutes Intravenous 3 times per day 07/29/15 2115     07/29/15 2100  vancomycin (VANCOCIN) IVPB 1000 mg/200 mL premix     1,000 mg 200 mL/hr over 60 Minutes Intravenous  Once 07/29/15 2053 07/29/15 2259     Code Status:      Code Status Orders        Start     Ordered   07/30/15 0127  Do not attempt resuscitation (DNR)   Continuous    Question Answer Comment  In the event of cardiac or respiratory ARREST Do not call a "code blue"   In the event of cardiac or respiratory ARREST Do not perform Intubation, CPR, defibrillation or ACLS   In the event of cardiac or respiratory ARREST Use medication by any route, position, wound care, and other measures to relive pain and suffering. May use oxygen, suction and manual treatment of airway obstruction as needed for comfort.      07/30/15 0126    Code Status History    Date Active Date Inactive Code Status Order ID Comments User Context   07/29/2015 11:12 PM 07/30/2015  1:26 AM Full Code [id number]  Bobette Mo, MD Inpatient     Family Communication: discussed with daughter and 2 sisters at bedsode Disposition Plan: follow in SDU DVT prophylaxis: SCDs Consultants: PCCM Procedures:     Objective: Filed Weights   07/29/15 2223  Weight: 128.4 kg (283 lb 1.1 oz)    Intake/Output Summary (Last 24 hours) at 07/30/15 1326 Last data filed at 07/30/15 1243  Gross per 24 hour  Intake    896 ml  Output     50 ml  Net    846 ml     Vitals Filed Vitals:   07/30/15 1200 07/30/15 1223 07/30/15 1243 07/30/15 1300  BP: 99/25 105/28 112/28 89/53  Pulse: 95 104  110  Temp:  98.1 F (36.7 C) 98.1 F (36.7 C)   TempSrc:  Oral Oral   Resp: 21 21  26   Height:      Weight:      SpO2: 100% 100%  97%    Exam:  General:  Pt is alert, not in acute distress, severe icterus, obese  HEENT: No icterus, No thrush, oral mucosa moist- scleral icterus  Cardiovascular: regular rate and rhythm, S1/S2 No murmur  Respiratory: clear to auscultation bilaterally   Abdomen: Soft, +Bowel sounds, non tender, + distended with fluid thrill, no guarding  MSK: No cyanosis or clubbing-3+ pedal edema, swelling of b/l arms  Skin: erythema and scaling due to  psoriasis   Data Reviewed: Basic Metabolic Panel:  Recent Labs Lab 07/29/15 1330 07/29/15 2313 07/30/15 0245 07/30/15 0745  NA 134* 134* 135 134*  K 3.7 3.4* 3.4* 3.6  CL 99* 100* 101 100*  CO2 25 25 25 24   GLUCOSE 94 102* 99 81  BUN 14 16 17 18   CREATININE 2.79* 2.89* 2.91* 2.81*  CALCIUM 8.4* 8.2* 7.9* 7.9*   Liver Function Tests:  Recent Labs Lab 07/29/15 1330 07/29/15 2313 07/30/15 0245 07/30/15 0745  AST 56* 44* 39 36  ALT 26 21 19 17   ALKPHOS 112 88 78 75  BILITOT 9.5* 9.5* 8.9* 8.9*  PROT 6.1* 5.0* 4.8* 5.0*  ALBUMIN 2.1* 1.8* 1.9* 2.2*   No results for input(s): LIPASE, AMYLASE in the last 168 hours.  Recent Labs Lab 07/29/15  2150  AMMONIA 69*   CBC:  Recent Labs Lab 07/29/15 1330 07/30/15 0245 07/30/15 0745  WBC 5.2 1.7* 1.3*  NEUTROABS  --  1.0*  --   HGB 9.7* 6.7* 6.5*  HCT 29.8* 20.2* 19.6*  MCV 103.5* 102.5* 102.6*  PLT 74* 34* 32*   Cardiac Enzymes: No results for input(s): CKTOTAL, CKMB, CKMBINDEX, TROPONINI in the last 168 hours. BNP (last 3 results)  Recent Labs  07/29/15 1330  BNP 109.7*    ProBNP (last 3 results) No results for input(s): PROBNP in the last 8760 hours.  CBG: No results for input(s): GLUCAP in the last 168 hours.  Recent Results (from the past 240 hour(s))  Blood culture (routine x 2)     Status: None (Preliminary result)   Collection Time: 07/29/15  9:45 PM  Result Value Ref Range Status   Specimen Description BLOOD LEFT FOREARM  Final   Special Requests   Final    BOTTLES DRAWN AEROBIC AND ANAEROBIC 5 ML Immunocompromised   Culture PENDING  Incomplete   Report Status PENDING  Incomplete  MRSA PCR Screening     Status: None   Collection Time: 07/29/15 10:28 PM  Result Value Ref Range Status   MRSA by PCR NEGATIVE NEGATIVE Final    Comment:        The GeneXpert MRSA Assay (FDA approved for NASAL specimens only), is one component of a comprehensive MRSA colonization surveillance program. It is  not intended to diagnose MRSA infection nor to guide or monitor treatment for MRSA infections.      Studies: Ct Chest Wo Contrast  07/30/2015  CLINICAL DATA:  Acute onset of generalized weakness and bilateral lower extremity swelling. Initial encounter. EXAM: CT CHEST WITHOUT CONTRAST TECHNIQUE: Multidetector CT imaging of the chest was performed following the standard protocol without IV contrast. COMPARISON:  Chest radiograph performed 07/29/2015 FINDINGS: Small left and trace right pleural effusions are seen, with minimal associated atelectasis. The lungs are otherwise clear. No masses are identified. The mediastinum is grossly unremarkable in appearance, aside from scattered coronary artery calcification. No mediastinal lymphadenopathy is seen. No pericardial effusion is identified. The great vessels are grossly unremarkable in appearance. No pericardial effusion The visualized portions of thyroid gland are unremarkable. No axillary lymphadenopathy is seen. Diffuse soft tissue edema is noted tracking about the abdominal wall and left chest wall, likely reflecting mild anasarca. A moderate amount of ascites noted tracking about the liver and spleen. The spleen is enlarged. The patient is status post cholecystectomy, with clips noted at the gallbladder fossa. There is heterogeneity of the liver, difficult to fully assess without contrast. No acute osseous abnormalities are seen. IMPRESSION: 1. Small left and trace right pleural effusions, with minimal associated atelectasis. Lungs otherwise clear. 2. Diffuse soft tissue edema tracking about the abdominal wall and left chest wall, likely reflecting mild anasarca. 3. Scattered coronary artery calcification. 4. Splenomegaly. 5. Moderate volume ascites noted at the upper abdomen. 6. Heterogeneity of the liver, not well assessed without contrast. Would correlate with LFTs. Electronically Signed   By: Roanna Raider M.D.   On: 07/30/2015 03:04   Dg Chest  Port 1 View  07/29/2015  CLINICAL DATA:  Sepsis. EXAM: PORTABLE CHEST 1 VIEW COMPARISON:  03/28/2015 FINDINGS: Stable cardiomegaly. Stable vascular congestion. No evidence of edema. No confluent airspace disease. Patient is rotated. No pleural effusion or pneumothorax. IMPRESSION: Stable cardiomegaly and vascular congestion. The superimposed acute process. Electronically Signed   By: Lujean Rave.D.  On: 07/29/2015 23:41    Scheduled Meds:  Scheduled Meds: . sodium chloride   Intravenous Once  . sodium chloride   Intravenous Once  . acidophilus  1 capsule Oral Daily  . albumin human  25 g Intravenous Q6H  . aztreonam  500 mg Intravenous 3 times per day  . cholecalciferol  1,000 Units Oral Daily  . ciprofloxacin  400 mg Intravenous QHS  . feeding supplement (ENSURE ENLIVE)  237 mL Oral BID BM  . lactulose  10 g Oral BID  . levothyroxine  50 mcg Oral Daily  . loratadine  10 mg Oral Daily  . phytonadione  10 mg Subcutaneous Daily  . triamcinolone cream   Topical BID  . vitamin B-12  1,000 mcg Oral Daily   Continuous Infusions:   Time spent on care of this patient: 40 min   Melisha Eggleton, MD 07/30/2015, 1:26 PM  LOS: 1 day   [facility] Office  [phone] Pager - Text Page per [email or web address] If 7PM-7AM, please contact night-coverage [email or web address]

## 2015-07-30 NOTE — Consult Note (Signed)
PULMONARY / CRITICAL CARE MEDICINE   Name: Lori Mills MRN: [id number] DOB: 07-22-1950    ADMISSION DATE:  07/29/2015 CONSULTATION DATE:  07/29/2015  REFERRING MD:  Dr. 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 She reports that more than 10 years ago she started taking several experimental medications for psoriasis which have now caused her to develop cirrhosis of the liver. She has been told she is not a candidate for liver transplant. She presented to [facility] emergency department 2/23 after noticing increased swelling to bilateral lower extremities and general feeling of malaise. Also complained of dysuria and back pain (which is chronic). In the emergency department she was noted to be hypotensive, however due to ascites and anasarca there was concern with volume resuscitation, so she was not given any IV fluid. Laboratory evaluation was concerning urinary tract infection. The patient was started on aztreonam and vancomycin. Other laboratory evaluation was significant for serum creatinine elevation to 2.8, albumin 2.1, ammonia elevated at 69, from a cytopenia, anemia. She had no fever or elevation of her white blood cell count. INR 3.16. She was admitted to the stepdown unit under the care of the hospitalist team, however, hypotension persisted. PCCM asked to see.  PAST MEDICAL HISTORY :  She  has a past medical history of Psoriasis; Psoriatic arthritis (HCC); Depression; HTN (hypertension); LBP (low back pain); Osteoarthritis; Osteopenia; Obesity; Allergic rhinitis; Decreased platelet count (HCC); Hepatomegaly; Cirrhosis of liver (HCC); Thrombocytopenia due to hypersplenism; Splenomegaly (02/11/2015); and Leukopenia (02/11/2015).  PAST  SURGICAL HISTORY: She  has past surgical history that includes None to Report.  Allergies  Allergen Reactions  . Cephalexin Itching    Can take Augmentin  . Dilaudid [Hydromorphone Hcl]     Feeling depressed on it  . Doxycycline Photosensitivity  . Enalapril Maleate   . Hydrochlorothiazide W-Triamterene   . Ibuprofen     REACTION: pain in abd  . Oxycodone     rash  . Penicillins   . Spironolactone   . Tramadol     n/v  . Bactrim [Sulfamethoxazole-Trimethoprim] Rash    No current facility-administered medications on file prior to encounter.   Current Outpatient Prescriptions on File Prior to Encounter  Medication Sig  . albuterol (PROVENTIL HFA;VENTOLIN HFA) 108 (90 BASE) MCG/ACT inhaler Inhale 1-2 puffs into the lungs every 6 (six) hours as needed for wheezing or shortness of breath.  . Cholecalciferol (VITAMIN D3) 1000 UNITS tablet Take 1,000 Units by mouth daily.    . fentaNYL (DURAGESIC) 12 MCG/HR Place 1 patch (12.5 mcg total) onto the skin every 3 (three) days.  . Lactobacillus (ACIDOPHILUS) CAPS Take 1 capsule by mouth daily.   Marland Kitchen loratadine (CLARITIN) 10 MG tablet Take 1 tablet (10 mg total) by mouth daily. For allergies  . triamcinolone cream (KENALOG) 0.1 % Apply topically 2 (two) times daily.  . vitamin B-12 (CYANOCOBALAMIN) 1000 MCG tablet Take 1,000 mcg by mouth daily.    . furosemide (LASIX) 40 MG tablet take 1 tablet by mouth if needed (WATER PILL) (Patient not taking: Reported on 07/29/2015)  . levothyroxine (SYNTHROID, LEVOTHROID) 50 MCG tablet take 1 tablet by mouth once daily  . losartan (COZAAR) 100 MG tablet take 1 tablet by mouth once daily (Patient not taking: Reported on 07/07/2015)  . potassium chloride (KLOR-CON 10)  10 MEQ tablet Take 1 tablet (10 mEq total) by mouth daily. (Patient not taking: Reported on 07/29/2015)    FAMILY HISTORY:  Her indicated that her mother is deceased. She indicated that her father is deceased.   SOCIAL HISTORY: She  reports  that she has never smoked. She does not have any smokeless tobacco history on file. She reports that she does not drink alcohol or use illicit drugs.  REVIEW OF SYSTEMS:   Bolds are positive  Constitutional: weight loss, gain, night sweats, Fevers, chills, fatigue .  HEENT: headaches, Sore throat, sneezing, nasal congestion, post nasal drip, Difficulty swallowing, Tooth/dental problems, visual complaints visual changes, ear ache CV:  chest pain, radiates: ,Orthopnea, PND, swelling in lower extremities, dizziness, palpitations, syncope.  GI  heartburn, indigestion, abdominal pain, nausea, vomiting, diarrhea, change in bowel habits, loss of appetite, bloody stools.  Resp: cough, productive: , hemoptysis, dyspnea mild ("SOB"), chest pain, pleuritic.  Skin: rash or itching or icterus GU: dysuria, change in color of urine, urgency or frequency. flank pain, hematuria  MS: joint pain or LE Edema. decreased range of motion  Psych: change in mood or affect. depression or anxiety.  Neuro: difficulty with speech, weakness, numbness, ataxia    SUBJECTIVE:    VITAL SIGNS: BP 71/34 mmHg  Pulse 100  Temp(Src) 98.1 F (36.7 C) (Rectal)  Resp 21  Ht 5\' 1"  (1.549 m)  Wt 128.4 kg (283 lb 1.1 oz)  BMI 53.51 kg/m2  SpO2 97%  HEMODYNAMICS:    VENTILATOR SETTINGS:    INTAKE / OUTPUT:    PHYSICAL EXAMINATION: General:  Morbidly obese female in NAD Neuro:  Alert, oriented, non-focal HEENT:  [place]/AT, PERRL, no JVD Cardiovascular:  RRR, no MRG Lungs:  Clear bilateral breath sounds Abdomen:  Soft, obese, fluctuant.  Musculoskeletal:  BLE nonpitting edema Skin:  Grossly intact. Venous stasis changes to bilateral lower extremities  LABS:  BMET  Recent Labs Lab 07/29/15 1330  NA 134*  K 3.7  CL 99*  CO2 25  BUN 14  CREATININE 2.79*  GLUCOSE 94    Electrolytes  Recent Labs Lab 07/29/15 1330  CALCIUM 8.4*    CBC  Recent Labs Lab 07/29/15 1330  WBC 5.2  HGB 9.7*  HCT 29.8*   PLT 74*    Coag's  Recent Labs Lab 07/29/15 2150  APTT 58*  INR 3.16*    Sepsis Markers No results for input(s): LATICACIDVEN, PROCALCITON, O2SATVEN in the last 168 hours.  ABG No results for input(s): PHART, PCO2ART, PO2ART in the last 168 hours.  Liver Enzymes  Recent Labs Lab 07/29/15 1330  AST 56*  ALT 26  ALKPHOS 112  BILITOT 9.5*  ALBUMIN 2.1*    Cardiac Enzymes No results for input(s): TROPONINI, PROBNP in the last 168 hours.  Glucose No results for input(s): GLUCAP in the last 168 hours.  Imaging Dg Chest Port 1 View  07/29/2015  CLINICAL DATA:  Sepsis. EXAM: PORTABLE CHEST 1 VIEW COMPARISON:  03/28/2015 FINDINGS: Stable cardiomegaly. Stable vascular congestion. No evidence of edema. No confluent airspace disease. Patient is rotated. No pleural effusion or pneumothorax. IMPRESSION: Stable cardiomegaly and vascular congestion. The superimposed acute process. Electronically Signed   By: 03/30/2015 M.D.   On: 07/29/2015 23:41     STUDIES:  CXR 2/23 > no acute cardiopulmonary process  CULTURES: 2/23 Blood > 2/23 Urine >  ANTIBIOTICS: Aztreonam 2/23 >>> Vancomycin 2/23 >>> Cipro 2/24 >>>  SIGNIFICANT EVENTS: 2/23 admit  LINES/TUBES:   DISCUSSION: 65  year old female with NASH cirrhosis. Presented 2/23 with malaise and dysuria. Found to have UTI in ED. She was hypotensive with anasarca so IVF was not given with concerns for volume overload. She was started on broad ABX for Urosepsis. PCCM consulted.  ASSESSMENT / PLAN:  PULMONARY A: No acute issues  P:   IS as tolerated  CARDIOVASCULAR A:  Hypotension - hypovolemia +/- severe sepsis H/o HTN  P:  Telemetry monitoring MAP goal > 80mm/Hg IVF resuscitation Albumin Assess lactic acid Insert arterial line as NIBP measurements are likely inaccurate May need CVL/Pressors, hope to avoid.   RENAL A:   Acute on chronic renal failure - suspect dehydration and sepsis playing the most  significant role, but BUN/Creat ratio would indicate more of an intra/post renal etiology. Concern for hepatorenal syndrome as well. She reports urine has been trickling out so postrenal possibility too.  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 Spontaneous bacterial peritonitis ? Cellulitis bilateral lower extremities L > R  P:   Aztreonam, Vanc as above Abd [place] to assess ascites Will add Cipro to empirically cover SBP (keep albumin while on cipro) Trend PCT  ENDOCRINE A:   No acute issues  P:   Monitor glucose on dailly BMP  NEUROLOGIC A:   Depression  P:   RASS goal: 0 Monitor Home meds per primary   FAMILY  - Updates: Patient updated 2/24 0100 by Sharlet Salina family meet or Palliative Care meeting due by:  3/1  Joneen Roach, AGACNP-BC [facility] Pager [phone] or [phone]  07/30/2015 1:18 AM  PCCM Attending Note: Patient seen and examined with nurse practitioner. Please refer to his consult note which I have reviewed in detail. Patient presenting with hypotension that is likely secondary to sepsis. Suspect possible cellulitis left lower extremity with wound versus SBP. Acute on chronic liver failure with acute on chronic renal failure likely secondary to state of shock. Checking abdominal ultrasound as well as CT chest to evaluate for possible left pleural effusion. Consider  diagnostic paracentesis if sufficient ascites. Continuing broad spectrum antibiotics with Vancomycin, Aztreonam, & Cipro. Patient oriented x4 with minimal asterixis. Continuing fluid resuscitation with crystalloid & albumin which the patient seems to be responding to. Patient reports a DNR/DNI status when I questioned her at bedside which I feel is appropriate. Previously turned down for transplant at [facility] and also [facility]. Continue close monitoring in ICU.   I have spent a total of 31 minutes of critical care time today caring for the patient and reviewing the patient's electronic medical record.  Donna Christen Jamison Neighbor, M.D. [facility] Pager:  [phone] After 3pm or if no response, call [phone]     ,

## 2015-07-30 NOTE — Progress Notes (Signed)
Pharmacy Antibiotic Note  Lori Mills is a 64 y.o. female admitted on 07/29/2015 with ascities/sbp.  Pharmacy has been consulted for Cipro dosing.  Plan:  Cipro 400mg  IV q24h  Height: 5\' 1"  (154.9 cm) Weight: 283 lb 1.1 oz (128.4 kg) IBW/kg (Calculated) : 47.8  Temp (24hrs), Avg:98 F (36.7 C), Min:97.6 F (36.4 C), Max:98.4 F (36.9 C)   Recent Labs Lab 07/29/15 1330 07/29/15 2313  WBC 5.2  --   CREATININE 2.79* 2.89*  LATICACIDVEN  --  3.0*    Estimated Creatinine Clearance: 24.2 mL/min (by C-G formula based on Cr of 2.89).    Allergies  Allergen Reactions  . Cephalexin Itching    Can take Augmentin  . Dilaudid [Hydromorphone Hcl]     Feeling depressed on it  . Doxycycline Photosensitivity  . Enalapril Maleate   . Hydrochlorothiazide W-Triamterene   . Ibuprofen     REACTION: pain in abd  . Oxycodone     rash  . Penicillins   . Spironolactone   . Tramadol     n/v  . Bactrim [Sulfamethoxazole-Trimethoprim] Rash    Antimicrobials this admission: 2/23 aztreonam >>  2/23 vanc >> 2/23 2/24 cipro >>  Dose adjustments this admission:   Microbiology results:  BCx:   UCx:    Sputum:    MRSA PCR:   Thank you for allowing pharmacy to be a part of this patient's care.  3/23 07/30/2015 1:30 AM

## 2015-07-30 NOTE — Clinical Documentation Improvement (Signed)
Internal Medicine Critical Care  Can the diagnosis of Shock be further specified? Thank you   Type:  Septic, Cardiogenic,  Hypovolemic, Hemorrhagic     Other type, including suspected or known cause and/or associated condition(s)  Other  Clinically Undetermined  Document any associated diagnoses/conditions.   Supporting Information: Doc in Critical Care  progress note 07/30/15 12:13 am [facility] (NP)/cosigned JN(MD)  Treatment IV fluids    Please exercise your independent, professional judgment when responding. A specific answer is not anticipated or expected.   Thank You,  Lavonda Jumbo Health Information Management [place] [phone]

## 2015-07-30 NOTE — Progress Notes (Addendum)
CRITICAL VALUE ALERT  Critical value received: WBC 1.3, hgb 6.5  Date of notification:  07/30/2015   Time of notification:  0812  Critical value read back:Yes.    Nurse who received alert:  Eddie Candle RN  MD notified (1st page):  Dr. Butler Denmark  Time of first page:  0825  MD notified (2nd page):  Time of second page:  Responding MD:  Dr. Butler Denmark  Time MD responded:  [phone]

## 2015-07-30 NOTE — Progress Notes (Addendum)
Initial Nutrition Assessment  DOCUMENTATION CODES:   Morbid obesity  INTERVENTION:  -Ensure Enlive po BID, each supplement provides 350 kcal and 20 grams of protein  NUTRITION DIAGNOSIS:   Inadequate oral intake related to poor appetite, chronic illness as evidenced by per patient/family report.  GOAL:   Patient will meet greater than or equal to 90% of their needs  MONITOR:   PO intake, I & O's, Skin, Labs, Supplement acceptance  REASON FOR ASSESSMENT:   Malnutrition Screening Tool    ASSESSMENT:   Lori Mills is a 65 y.o. 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  Spoke with pt at bedside. She endorses a poor appetite & depression since her Husband passed away in February 17, 2023. She stated she might eat a tangerine and some cereal for breakfast, and then many times she wouldn't get lunch down and that was her last meal for the day.  She said she noticed changes in her face and shoulders but her weight on the scale stayed the same. Pt is carrying a bit of fluid at this time related to liver and kidney issues.  Unable to complete Nutrition-Focused physical exam at this time.   Labs: Cr 2.81, Ca 7.9 Medications reviewed.   Diet Order:  Diet regular Room service appropriate?: Yes; Fluid consistency:: Thin  Skin:  Reviewed, no issues  Last BM:  PTA  Height:   Ht Readings from Last 1 Encounters:  07/29/15 5\' 1"  (1.549 m)    Weight:   Wt Readings from Last 1 Encounters:  07/29/15 283 lb 1.1 oz (128.4 kg)    Ideal Body Weight:  47.72 kg  BMI:  Body mass index is 53.51 kg/(m^2).  Estimated Nutritional Needs:    Kcal:  1700-1900 calories  Protein:  130-150 grams  Fluid:  >/= 1.7L  EDUCATION NEEDS:   No education needs identified at this time  07/31/15. Mattye Verdone, MS, RD LDN After Hours/Weekend Pager [phone]

## 2015-07-30 NOTE — Care Management Note (Signed)
Case Management Note  Patient Details  Name: Brynn P [place] MRN: [id number] Date of Birth: [birth year]  Subjective/Objective:      Anemia and sepsis              Action/Plan:Date: July 30, 2015 Chart reviewed for concurrent status and case management needs. Will continue to follow patient for changes and needs: Marcelle Smiling, BSN, RN, [place]   [phone]   Expected Discharge Date:                  Expected Discharge Plan:  Home/Self Care  In-House Referral:  NA  Discharge planning Services  CM Consult  Post Acute Care Choice:  NA Choice offered to:  NA  DME Arranged:    DME Agency:     HH Arranged:    HH Agency:     Status of Service:  In process, will continue to follow  Medicare Important Message Given:    Date Medicare IM Given:    Medicare IM give by:    Date Additional Medicare IM Given:    Additional Medicare Important Message give by:     If discussed at Long Length of Stay Meetings, dates discussed:    Additional Comments:  Golda Acre, RN 07/30/2015, 8:07 AM

## 2015-07-30 NOTE — Progress Notes (Addendum)
[facility] PCCM  S: Feels a little better  O:  Filed Vitals:   07/30/15 0700 07/30/15 0800 07/30/15 0900 07/30/15 0932  BP:  123/47 110/23 88/17  Pulse: 88 104 98 94  Temp:  97.8 F (36.6 C) 97.6 F (36.4 C) 97.8 F (36.6 C)  TempSrc:  Oral Oral Oral  Resp: 18 18 23 19   Height:      Weight:      SpO2: 92% 99% 95% 97%   RA  Gen: chronically ill appearing, jaundice HENT: NCAT, scleral icterus, EOMi PULM: CTA B CV: RRR, no mgr GI: Belly distended, + fluid wave Derm: chronic venous stasis changes, psoriatic changes knees Neuro: A&Ox4, maew  CBC reviewed: pancytopenia  Impression/Plan: Hypotension improving with PRBC now> monitor post transfusion, continue albumin UTI> continue current antibiotics Ascites> agree paracentesis would be helpful, patient concerned about it with thrombocytopenia; would need to transfuse platelets;  AKI > continue albumin/PRBC  Family updated bedside Discussed with Dr.  Additional cc time by me 40 minutes  Butler Denmark, MD [facility] Pager: [phone] Cell: [phone] After 3pm or if no response, call [phone]

## 2015-07-30 NOTE — Clinical Documentation Improvement (Signed)
Internal Medicine Critical Care  Can the diagnosis of anemia be further specified?  Thank you   Iron deficiency Anemia  Nutritional anemia, including the nutrition or mineral deficits  Chronic Anemia, including the suspected or known cause  Anemia of chronic disease, including the associated chronic disease state  Other  Clinically Undetermined  Document any associated diagnoses/conditions.   Supporting Information: Transfused 1 unit PRBC , Platelets   HGB 9.7 to 6.7   Please exercise your independent, professional judgment when responding. A specific answer is not anticipated or expected.   Thank You,  Lavonda Jumbo Health Information Management  [phone]

## 2015-07-30 NOTE — Progress Notes (Signed)
eLink Physician-Brief Progress Note Patient Name: Lori Mills DOB: May 21, 1951 MRN: [id number]   Date of Service  07/30/2015  HPI/Events of Note  Hypokalemia and Hgb of 6.7  eICU Interventions  Plan: Replace potassium Transfuse 1 unit of pRBC Post-transfusion CBC     Intervention Category Intermediate Interventions: Electrolyte abnormality - evaluation and management;Other:  DETERDING,ELIZABETH 07/30/2015, 3:43 AM

## 2015-07-31 DIAGNOSIS — D61818 Other pancytopenia: Secondary | ICD-10-CM

## 2015-07-31 DIAGNOSIS — I10 Essential (primary) hypertension: Secondary | ICD-10-CM

## 2015-07-31 DIAGNOSIS — K7469 Other cirrhosis of liver: Secondary | ICD-10-CM

## 2015-07-31 DIAGNOSIS — I959 Hypotension, unspecified: Secondary | ICD-10-CM

## 2015-07-31 DIAGNOSIS — E038 Other specified hypothyroidism: Secondary | ICD-10-CM

## 2015-07-31 LAB — CBC WITH DIFFERENTIAL/PLATELET
Basophils Absolute: 0 10*3/uL (ref 0.0–0.1)
Basophils Relative: 2 %
Eosinophils Absolute: 0.1 10*3/uL (ref 0.0–0.7)
Eosinophils Relative: 5 %
HEMATOCRIT: 22.2 % — AB (ref 36.0–46.0)
HEMOGLOBIN: 7.4 g/dL — AB (ref 12.0–15.0)
LYMPHS ABS: 0.4 10*3/uL — AB (ref 0.7–4.0)
LYMPHS PCT: 22 %
MCH: 33.5 pg (ref 26.0–34.0)
MCHC: 33.3 g/dL (ref 30.0–36.0)
MCV: 100.5 fL — AB (ref 78.0–100.0)
MONO ABS: 0.1 10*3/uL (ref 0.1–1.0)
MONOS PCT: 6 %
NEUTROS ABS: 1.1 10*3/uL — AB (ref 1.7–7.7)
NEUTROS PCT: 65 %
Platelets: 34 10*3/uL — ABNORMAL LOW (ref 150–400)
RBC: 2.21 MIL/uL — ABNORMAL LOW (ref 3.87–5.11)
RDW: 18.8 % — AB (ref 11.5–15.5)
WBC: 1.7 10*3/uL — ABNORMAL LOW (ref 4.0–10.5)

## 2015-07-31 LAB — COMPREHENSIVE METABOLIC PANEL
ALK PHOS: 85 U/L (ref 38–126)
ALT: 20 U/L (ref 14–54)
ANION GAP: 11 (ref 5–15)
AST: 38 U/L (ref 15–41)
Albumin: 3.1 g/dL — ABNORMAL LOW (ref 3.5–5.0)
BUN: 20 mg/dL (ref 6–20)
CALCIUM: 8.7 mg/dL — AB (ref 8.9–10.3)
CHLORIDE: 104 mmol/L (ref 101–111)
CO2: 24 mmol/L (ref 22–32)
Creatinine, Ser: 2 mg/dL — ABNORMAL HIGH (ref 0.44–1.00)
GFR calc non Af Amer: 25 mL/min — ABNORMAL LOW (ref >60)
GFR, EST AFRICAN AMERICAN: 29 mL/min — AB (ref >60)
GLUCOSE: 85 mg/dL (ref 65–99)
POTASSIUM: 3.6 mmol/L (ref 3.5–5.1)
Sodium: 139 mmol/L (ref 135–145)
Total Bilirubin: 10.5 mg/dL — ABNORMAL HIGH (ref 0.3–1.2)
Total Protein: 5.9 g/dL — ABNORMAL LOW (ref 6.5–8.1)

## 2015-07-31 LAB — PROTIME-INR
INR: 3.38 — AB (ref 0.00–1.49)
Prothrombin Time: 32.5 seconds — ABNORMAL HIGH (ref 11.6–15.2)

## 2015-07-31 LAB — PREPARE RBC (CROSSMATCH)

## 2015-07-31 MED ORDER — HYDROCORTISONE ACETATE 25 MG RE SUPP: 25.0000 mg | Freq: Two times a day (BID) | RECTAL | Status: AC

## 2015-07-31 MED ORDER — SODIUM CHLORIDE 0.9 % IV SOLN
Freq: Once | INTRAVENOUS | Status: AC
  Administered 2015-07-31: 10 mL/h via INTRAVENOUS

## 2015-07-31 MED ORDER — CIPROFLOXACIN IN D5W 400 MG/200ML IV SOLN
400.0000 mg | Freq: Two times a day (BID) | INTRAVENOUS | Status: DC
  Administered 2015-07-31: 400 mg via INTRAVENOUS
  Filled 2015-07-31: qty 200

## 2015-07-31 MED ORDER — LACTULOSE 10 GM/15ML PO SOLN: 10.0000 g | Freq: Two times a day (BID) | ORAL | Status: AC

## 2015-07-31 MED ORDER — HYDROCORTISONE ACETATE 25 MG RE SUPP
25.0000 mg | Freq: Two times a day (BID) | RECTAL | Status: DC
  Administered 2015-07-31: 25 mg via RECTAL
  Filled 2015-07-31 (×2): qty 1

## 2015-07-31 MED ORDER — ENSURE ENLIVE PO LIQD: 237.0000 mL | Freq: Two times a day (BID) | ORAL | Status: DC

## 2015-07-31 MED ORDER — CIPROFLOXACIN HCL 500 MG PO TABS: 500.0000 mg | ORAL_TABLET | Freq: Two times a day (BID) | ORAL | Status: DC

## 2015-07-31 NOTE — Discharge Summary (Signed)
Physician Discharge Summary  Lori Mills EZM:[id number] DOB: 11-Mar-1951 DOA: 07/29/2015  PCP: Sonda Primes, MD  Admit date: 07/29/2015 Discharge date: 07/31/2015  Time spent: 50 minutes  Recommendations for Outpatient Follow-up:  1. CBC and basic metabolic panel in 1 wk  Discharge Condition: Stable    Discharge Diagnoses:  Principal Problem:   Hypotension Active Problems:   Hypothyroidism   OBESITY, MORBID   Essential hypertension   Liver cirrhosis (HCC)   Splenomegaly   Chronic renal failure   Jaundice   Anasarca   Acute renal failure (HCC)   Ascites   Pancytopenia (HCC)   History of present illness:  Lori Mills is a 65 y.o. female with a history of psoriasis, psoriatic arthritis, cirrhosis of the liver secondary to [facility], hypertension, depression, morbid obesity, chronic lower back pain who presents to the hospital with decreased urine output for 2 days. She states that she was previously on Lasix but her PCP discontinued this because her kidneys were looking worse. She states that she has developed progressive swelling in her legs and her abdomen and it got to a point where her legs were too heavy for her to lift. In the ER she was found to have hypotension, lactic acidosis and acute renal failure. She was also noted to have a UA that was positive for UTI and she was therefore admitted.  Hospital Course:  Principal Problem:  Hypotension/lactic acidosis/AKI -Suspect secondary to dehydration due to severe third spacing and poor oral intake - she may also have had  sepsis secondary to a UTI -BP Has improved with significantly IV fluids, IV albumin, IV midodrine and antibiotics  UTI - cont Cipro  -Urine culture has not yet grown any organisms and has been re-incubated -Blood cultures 2 sets have been negative -We'll discharge home to complete a 3 day course of ciprofloxacin for a non-complicated UTI  Anasarca/hypoalbuminemia due to severe protein calorie  malnutrition -Patient states that by mouth intake has been extremely poor since her husband died about 8 months ago and this has likely led to hypoalbuminemia and severe third spacing - Albumin level was 1.8 (2/24) -For the past 48 hours we have been giving albumin infusions along with IV fluids and therefore measured albumin on blood work has risen to 3.1 -We'll continue to hold Lasix and losartan due to acute renal failure -Have talked to her extensively about connection between poor oral intake, hypoalbuminemia and anasarca -Have recommended that she has protein with each meal and have added ensure   Acute renal failure -Creatinine was 0.6 ON October 2016 and has jumped to 2.56 since 07/07/2015 -Suspect this is secondary to severe third spacing/anasarca in setting of Lasix and losartan use - Lasix and valsartan were held by her PCP a few days ago - started Midodrine and albumin in case it was secondary to Hepatorenal syndrome - FeNa calculated this morning- determined to be pre-renal etiology therefore will continue to hold Lasix - Creatinine has improved slightly with hydration-advised to continue to maintain adequate hydration-advise that Bmet be checked again in 1-2 wks   Liver cirrhosis (NASH) with resultant portal hypertension, splenomegaly, ascites, pancytopenia, coagulopathy, jaundice -Appears to be progressive and end-stage as bilirubin has risen to 10 and her INR is now elevated at 3.38 -She also states that her abdomen is more swollen than before which is likely secondary to ascites and due to renal failure and dehydration, Lasix has been discontinued - She has been declined for liver transplant due to her  morbid obesity - I have discussed with her, her daughter and her 2 sisters and daughter that her liver failure is clearly progressed and is now severe-cont supportive care  Anemia - admits that her hemorrhoids have been bleeding for quite a while now which likely is a factor in  the anemia -1 unit of packed red blood cells transfused yesterday hemoglobin dropped to 6.5 with IV hydration -1 unit of packed red blood cells will be transfused today prior to discharge as hemoglobin is still only 7.4  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, hypersplenism and ITP -Platelet count was 32,002/24-As she had a significant amount of bloody stool in the morning that day, I  ordered a unit of platelets to be transfused in order to get her platelet level greater than 50,000 she unfortunately leaflets continue to be low at 34 -WBC count has been in the 1-2 range  Leukopenia -Possibly secondary to hypersplenism-she has a follow-up appointment with her hematologist coming up soon -Recommend a repeat CBC in 1 week  Hemorrhoidal bleed - large amount of blood noted on 2/24-this has resolved - started Anusol  Elevated INR -I have ordered daily vitamin K but I suspect this is secondary to severe cirrhosis  Psoriasis and Psoriatic arthritis - Kenalog cream   Consultations:  Pulmonary critical care  Discharge Exam: [facility] Weights   07/29/15 2223 07/31/15 0225  Weight: 128.4 kg (283 lb 1.1 oz) 132 kg (291 lb 0.1 oz)   Filed Vitals:   07/31/15 1317 07/31/15 1320  BP:  137/42  Pulse: 95   Temp: 98.1 F (36.7 C)   Resp: 21     General: AAO x 3, no distress Cardiovascular: RRR, no murmurs  Respiratory: clear to auscultation bilaterally GI: soft, non-tender, non-distended, bowel sound positive  Discharge Instructions You were cared for by a hospitalist during your hospital stay. If you have any questions about your discharge medications or the care you received while you were in the hospital after you are discharged, you can call the unit and asked to speak with the hospitalist on call if the hospitalist that took care of you is not available. Once you are discharged, your primary care physician will handle any further medical issues. Please note that NO REFILLS for any  discharge medications will be authorized once you are discharged, as it is imperative that you return to your primary care physician (or establish a relationship with a primary care physician if you do not have one) for your aftercare needs so that they can reassess your need for medications and monitor your lab values.      Discharge Instructions    Discharge instructions    Complete by:  As directed   Regular diet     Increase activity slowly    Complete by:  As directed             Medication List    STOP taking these medications        furosemide 40 MG tablet  Commonly known as:  LASIX     losartan 100 MG tablet  Commonly known as:  COZAAR     potassium chloride 10 MEQ tablet  Commonly known as:  KLOR-CON 10      TAKE these medications        Acidophilus Caps capsule  Take 1 capsule by mouth daily.     albuterol 108 (90 Base) MCG/ACT inhaler  Commonly known as:  PROVENTIL HFA;VENTOLIN HFA  Inhale 1-2 puffs into the lungs  every 6 (six) hours as needed for wheezing or shortness of breath.     cholecalciferol 1000 units tablet  Commonly known as:  VITAMIN D  Take 1,000 Units by mouth daily.     ciprofloxacin 500 MG tablet  Commonly known as:  CIPRO  Take 1 tablet (500 mg total) by mouth 2 (two) times daily.     feeding supplement (ENSURE ENLIVE) Liqd  Take 237 mLs by mouth 2 (two) times daily between meals.     fentaNYL 12 MCG/HR  Commonly known as:  DURAGESIC  Place 1 patch (12.5 mcg total) onto the skin every 3 (three) days.     hydrocortisone 25 MG suppository  Commonly known as:  ANUSOL-HC  Place 1 suppository (25 mg total) rectally 2 (two) times daily.     lactulose 10 GM/15ML solution  Commonly known as:  CHRONULAC  Take 15 mLs (10 g total) by mouth 2 (two) times daily.     levothyroxine 50 MCG tablet  Commonly known as:  SYNTHROID, LEVOTHROID  take 1 tablet by mouth once daily     loratadine 10 MG tablet  Commonly known as:  CLARITIN  Take 1  tablet (10 mg total) by mouth daily. For allergies     milk thistle 175 MG tablet  Take 175 mg by mouth daily as needed (cleanse liver).     triamcinolone cream 0.1 %  Commonly known as:  KENALOG  Apply topically 2 (two) times daily.     vitamin B-12 1000 MCG tablet  Commonly known as:  CYANOCOBALAMIN  Take 1,000 mcg by mouth daily.       Allergies  Allergen Reactions  . Cephalexin Itching    Can take Augmentin  . Dilaudid [Hydromorphone Hcl]     Feeling depressed on it  . Doxycycline Photosensitivity  . Enalapril Maleate   . Hydrochlorothiazide W-Triamterene   . Ibuprofen     REACTION: pain in abd  . Oxycodone     rash  . Penicillins   . Spironolactone   . Tramadol     n/v  . Bactrim [Sulfamethoxazole-Trimethoprim] Rash      The results of significant diagnostics from this hospitalization (including imaging, microbiology, ancillary and laboratory) are listed below for reference.    Significant Diagnostic Studies: Ct Chest Wo Contrast  07/30/2015  CLINICAL DATA:  Acute onset of generalized weakness and bilateral lower extremity swelling. Initial encounter. EXAM: CT CHEST WITHOUT CONTRAST TECHNIQUE: Multidetector CT imaging of the chest was performed following the standard protocol without IV contrast. COMPARISON:  Chest radiograph performed 07/29/2015 FINDINGS: Small left and trace right pleural effusions are seen, with minimal associated atelectasis. The lungs are otherwise clear. No masses are identified. The mediastinum is grossly unremarkable in appearance, aside from scattered coronary artery calcification. No mediastinal lymphadenopathy is seen. No pericardial effusion is identified. The great vessels are grossly unremarkable in appearance. No pericardial effusion The visualized portions of thyroid gland are unremarkable. No axillary lymphadenopathy is seen. Diffuse soft tissue edema is noted tracking about the abdominal wall and left chest wall, likely reflecting mild  anasarca. A moderate amount of ascites noted tracking about the liver and spleen. The spleen is enlarged. The patient is status post cholecystectomy, with clips noted at the gallbladder fossa. There is heterogeneity of the liver, difficult to fully assess without contrast. No acute osseous abnormalities are seen. IMPRESSION: 1. Small left and trace right pleural effusions, with minimal associated atelectasis. Lungs otherwise clear. 2. Diffuse soft tissue edema tracking  about the abdominal wall and left chest wall, likely reflecting mild anasarca. 3. Scattered coronary artery calcification. 4. Splenomegaly. 5. Moderate volume ascites noted at the upper abdomen. 6. Heterogeneity of the liver, not well assessed without contrast. Would correlate with LFTs. Electronically Signed   By: Roanna Raider M.D.   On: 07/30/2015 03:04   Dg Chest Port 1 View  07/29/2015  CLINICAL DATA:  Sepsis. EXAM: PORTABLE CHEST 1 VIEW COMPARISON:  03/28/2015 FINDINGS: Stable cardiomegaly. Stable vascular congestion. No evidence of edema. No confluent airspace disease. Patient is rotated. No pleural effusion or pneumothorax. IMPRESSION: Stable cardiomegaly and vascular congestion. The superimposed acute process. Electronically Signed   By: Rubye Oaks M.D.   On: 07/29/2015 23:41    Microbiology: Recent Results (from the past 240 hour(s))  Urine culture     Status: None (Preliminary result)   Collection Time: 07/29/15  4:32 PM  Result Value Ref Range Status   Specimen Description URINE, CATHETERIZED  Final   Special Requests Immunocompromised  Final   Culture   Final    CULTURE REINCUBATED FOR BETTER GROWTH Performed at [facility]    Report Status PENDING  Incomplete  Blood culture (routine x 2)     Status: None (Preliminary result)   Collection Time: 07/29/15  9:38 PM  Result Value Ref Range Status   Specimen Description BLOOD LEFT HAND  Final   Special Requests   Final    BOTTLES DRAWN AEROBIC AND  ANAEROBIC 5 ML Immunocompromised   Culture   Final    NO GROWTH 2 DAYS Performed at [facility]    Report Status PENDING  Incomplete  Blood culture (routine x 2)     Status: None (Preliminary result)   Collection Time: 07/29/15  9:45 PM  Result Value Ref Range Status   Specimen Description BLOOD LEFT FOREARM  Final   Special Requests   Final    BOTTLES DRAWN AEROBIC AND ANAEROBIC 5 ML Immunocompromised   Culture   Final    NO GROWTH 2 DAYS Performed at [facility]    Report Status PENDING  Incomplete  MRSA PCR Screening     Status: None   Collection Time: 07/29/15 10:28 PM  Result Value Ref Range Status   MRSA by PCR NEGATIVE NEGATIVE Final    Comment:        The GeneXpert MRSA Assay (FDA approved for NASAL specimens only), is one component of a comprehensive MRSA colonization surveillance program. It is not intended to diagnose MRSA infection nor to guide or monitor treatment for MRSA infections.      Labs: Basic Metabolic Panel:  Recent Labs Lab 07/29/15 1330 07/29/15 2313 07/30/15 0245 07/30/15 0745 07/31/15 0521  NA 134* 134* 135 134* 139  K 3.7 3.4* 3.4* 3.6 3.6  CL 99* 100* 101 100* 104  CO2 25 25 25 24 24   GLUCOSE 94 102* 99 81 85  BUN 14 16 17 18 20   CREATININE 2.79* 2.89* 2.91* 2.81* 2.00*  CALCIUM 8.4* 8.2* 7.9* 7.9* 8.7*   Liver Function Tests:  Recent Labs Lab 07/29/15 1330 07/29/15 2313 07/30/15 0245 07/30/15 0745 07/31/15 0521  AST 56* 44* 39 36 38  ALT 26 21 19 17 20   ALKPHOS 112 88 78 75 85  BILITOT 9.5* 9.5* 8.9* 8.9* 10.5*  PROT 6.1* 5.0* 4.8* 5.0* 5.9*  ALBUMIN 2.1* 1.8* 1.9* 2.2* 3.1*   No results for input(s): LIPASE, AMYLASE in the last 168 hours.  Recent Labs  Lab 07/29/15 2150  AMMONIA 69*   CBC:  Recent Labs Lab 07/29/15 1330 07/30/15 0245 07/30/15 0745 07/31/15 0521  WBC 5.2 1.7* 1.3* 1.7*  NEUTROABS  --  1.0*  --  1.1*  HGB 9.7* 6.7* 6.5* 7.4*  HCT 29.8* 20.2* 19.6* 22.2*  MCV 103.5*  102.5* 102.6* 100.5*  PLT 74* 34* 32* 34*   Cardiac Enzymes: No results for input(s): CKTOTAL, CKMB, CKMBINDEX, TROPONINI in the last 168 hours. BNP: BNP (last 3 results)  Recent Labs  07/29/15 1330  BNP 109.7*    ProBNP (last 3 results) No results for input(s): PROBNP in the last 8760 hours.  CBG: No results for input(s): GLUCAP in the last 168 hours.     SignedCalvert Cantor, MD [facility] 07/31/2015, 1:38 PM

## 2015-07-31 NOTE — Progress Notes (Signed)
Pharmacy Antibiotic Note  Lori Mills is a 66 y.o. female admitted on 07/29/2015 with ascites, possible SBP and UTI.  Pharmacy has been consulted for Cipro dosing. Patient with h/o CKD, admitted with elevated SCr, possibly hepatorenal.  Scr improved this am.   Day #2 ciprofloxacin  Plan:  Change to Cipro 400mg  IV q12h for improved renal fx  Await cultures  Height: 5\' 1"  (154.9 cm) Weight: 291 lb 0.1 oz (132 kg) IBW/kg (Calculated) : 47.8  Temp (24hrs), Avg:98.1 F (36.7 C), Min:97.6 F (36.4 C), Max:98.7 F (37.1 C)   Recent Labs Lab 07/29/15 1330 07/29/15 2313 07/30/15 0245 07/30/15 0745 07/31/15 0521  WBC 5.2  --  1.7* 1.3* 1.7*  CREATININE 2.79* 2.89* 2.91* 2.81* 2.00*  LATICACIDVEN  --  3.0* 2.1*  --   --     Estimated Creatinine Clearance: 35.6 mL/min (by C-G formula based on Cr of 2).    Allergies  Allergen Reactions  . Cephalexin Itching    Can take Augmentin  . Dilaudid [Hydromorphone Hcl]     Feeling depressed on it  . Doxycycline Photosensitivity  . Enalapril Maleate   . Hydrochlorothiazide W-Triamterene   . Ibuprofen     REACTION: pain in abd  . Oxycodone     rash  . Penicillins   . Spironolactone   . Tramadol     n/v  . Bactrim [Sulfamethoxazole-Trimethoprim] Rash    Antimicrobials this admission: 2/23 aztreonam >> 2/25 2/23 vanc >> 2/23 2/24 cipro >>  Dose adjustments this admission:   Microbiology results: 2/23 BCx: NGTD 2/23 UCx: reincubated  2/23 MRSA PCR: neg  Thank you for allowing pharmacy to be a part of this patient's care.  3/23, PharmD, BCPS.   Pager: [phone]/23 07/31/2015 7:25 AM

## 2015-07-31 NOTE — Progress Notes (Signed)
D./w dr Butler Denmark of [facility]  -ccm can sign off without seeing 07/31/2015   Dr. Kalman Shan, M.D., [facility].C.P Pulmonary and Critical Care Medicine Staff Physician [facility] [place] [facility] Pager: [phone], If no answer or between  15:00h - 7:00h: call [phone]  07/31/2015 9:05 AM

## 2015-07-31 NOTE — Progress Notes (Signed)
Patient discharged to home with family.  Patient given educational handout.  Instructed the patient that the MD would like her to take the discharge handout to her outpatient appointment.

## 2015-08-01 LAB — URINE CULTURE: Culture: >=100000

## 2015-08-02 ENCOUNTER — Ambulatory Visit (INDEPENDENT_AMBULATORY_CARE_PROVIDER_SITE_OTHER): Payer: Medicare Other | Admitting: Internal Medicine

## 2015-08-02 ENCOUNTER — Encounter: Payer: Self-pay | Admitting: Internal Medicine

## 2015-08-02 VITALS — BP 150/94 | HR 104 | Temp 98.2°F | Wt 289.0 lb

## 2015-08-02 DIAGNOSIS — F419 Anxiety disorder, unspecified: Secondary | ICD-10-CM | POA: Diagnosis not present

## 2015-08-02 DIAGNOSIS — K7469 Other cirrhosis of liver: Secondary | ICD-10-CM | POA: Diagnosis not present

## 2015-08-02 DIAGNOSIS — G2581 Restless legs syndrome: Secondary | ICD-10-CM

## 2015-08-02 DIAGNOSIS — D6959 Other secondary thrombocytopenia: Secondary | ICD-10-CM

## 2015-08-02 DIAGNOSIS — M5440 Lumbago with sciatica, unspecified side: Secondary | ICD-10-CM

## 2015-08-02 DIAGNOSIS — R17 Unspecified jaundice: Secondary | ICD-10-CM | POA: Diagnosis not present

## 2015-08-02 DIAGNOSIS — E43 Unspecified severe protein-calorie malnutrition: Secondary | ICD-10-CM | POA: Insufficient documentation

## 2015-08-02 DIAGNOSIS — G8929 Other chronic pain: Secondary | ICD-10-CM

## 2015-08-02 DIAGNOSIS — D61818 Other pancytopenia: Secondary | ICD-10-CM

## 2015-08-02 DIAGNOSIS — E038 Other specified hypothyroidism: Secondary | ICD-10-CM

## 2015-08-02 LAB — TYPE AND SCREEN
ABO/RH(D): O POS
Antibody Screen: NEGATIVE
Unit division: 0
Unit division: 0

## 2015-08-02 LAB — PREPARE PLATELET PHERESIS: UNIT DIVISION: 0

## 2015-08-02 MED ORDER — ROPINIROLE HCL 0.25 MG PO TABS: 0.2500 mg | ORAL_TABLET | Freq: Every day | ORAL | Status: AC

## 2015-08-02 MED ORDER — HYDROXYZINE HCL 10 MG PO TABS: 10.0000 mg | ORAL_TABLET | Freq: Three times a day (TID) | ORAL | Status: DC | PRN

## 2015-08-02 MED ORDER — ENSURE ENLIVE PO LIQD: 1.0000 | Freq: Two times a day (BID) | ORAL | Status: AC

## 2015-08-02 MED ORDER — LORAZEPAM 0.5 MG PO TABS: 0.5000 mg | ORAL_TABLET | Freq: Three times a day (TID) | ORAL | Status: AC | PRN

## 2015-08-02 NOTE — Assessment & Plan Note (Signed)
Chronic Requip helped at the hospital Lorazepam prn w/cauton

## 2015-08-02 NOTE — Assessment & Plan Note (Signed)
Since Jan 2016 - monitor labs

## 2015-08-02 NOTE — Assessment & Plan Note (Signed)
Multiple intolerance Will try Hydrocodone again w/caution

## 2015-08-02 NOTE — Assessment & Plan Note (Signed)
Appt w/Dr Myna Hidalgo on 08/06/15 pending

## 2015-08-02 NOTE — Assessment & Plan Note (Addendum)
Severe NASH Pt was told she is not a candidate for a liver transplant Dr Joellyn Rued

## 2015-08-02 NOTE — Progress Notes (Signed)
Subjective:  Patient ID: Lori Mills, female    DOB: 1951-05-06  Age: 65 y.o. MRN: [id number]  CC: No chief complaint on file.   HPI   Lori Mills presents for a post-hosp f/u for liver failure and a UTI f/u - hosp records from last week were reviewed. C/o weakness, UTI, chronic pain, jaundice, bruising. F/u edema, malnutrition  Outpatient Prescriptions Prior to Visit  Medication Sig Dispense Refill  . albuterol (PROVENTIL HFA;VENTOLIN HFA) 108 (90 BASE) MCG/ACT inhaler Inhale 1-2 puffs into the lungs every 6 (six) hours as needed for wheezing or shortness of breath. 1 Inhaler 0  . Cholecalciferol (VITAMIN D3) 1000 UNITS tablet Take 1,000 Units by mouth daily.      . ciprofloxacin (CIPRO) 500 MG tablet Take 1 tablet (500 mg total) by mouth 2 (two) times daily. 3 tablet 0  . hydrocortisone (ANUSOL-HC) 25 MG suppository Place 1 suppository (25 mg total) rectally 2 (two) times daily. 12 suppository 0  . Lactobacillus (ACIDOPHILUS) CAPS Take 1 capsule by mouth daily.     Marland Kitchen lactulose (CHRONULAC) 10 GM/15ML solution Take 15 mLs (10 g total) by mouth 2 (two) times daily. 240 mL 0  . levothyroxine (SYNTHROID, LEVOTHROID) 50 MCG tablet take 1 tablet by mouth once daily 30 tablet 11  . loratadine (CLARITIN) 10 MG tablet Take 1 tablet (10 mg total) by mouth daily. For allergies 100 tablet 3  . milk thistle 175 MG tablet Take 175 mg by mouth daily as needed (cleanse liver).    . triamcinolone cream (KENALOG) 0.1 % Apply topically 2 (two) times daily. 30 g 1  . vitamin B-12 (CYANOCOBALAMIN) 1000 MCG tablet Take 1,000 mcg by mouth daily.      . feeding supplement, ENSURE ENLIVE, (ENSURE ENLIVE) LIQD Take 237 mLs by mouth 2 (two) times daily between meals. 237 mL 12  . fentaNYL (DURAGESIC) 12 MCG/HR Place 1 patch (12.5 mcg total) onto the skin every 3 (three) days. 15 patch 0   No facility-administered medications prior to visit.    ROS Review of Systems  Constitutional: Positive for  chills and fatigue. Negative for activity change, appetite change and unexpected weight change.  HENT: Negative for congestion, mouth sores, nosebleeds, sinus pressure and trouble swallowing.   Eyes: Negative for visual disturbance.  Respiratory: Negative for cough and chest tightness.   Gastrointestinal: Positive for diarrhea. Negative for nausea, abdominal pain and constipation.  Endocrine: Negative for polyuria.  Genitourinary: Negative for dysuria, frequency, difficulty urinating and vaginal pain.  Musculoskeletal: Positive for back pain and arthralgias. Negative for gait problem and neck stiffness.  Skin: Negative for pallor and rash.  Neurological: Negative for dizziness, tremors, weakness, numbness and headaches.  Hematological: Bruises/bleeds easily.  Psychiatric/Behavioral: Positive for suicidal ideas and decreased concentration. Negative for confusion, sleep disturbance and agitation. The patient is nervous/anxious.     Objective:  BP 150/94 mmHg  Pulse 104  Temp(Src) 98.2 F (36.8 C) (Oral)  Wt 289 lb (131.09 kg)  SpO2 98%  BP Readings from Last 3 Encounters:  08/02/15 150/94  07/31/15 137/42  07/07/15 138/40    Wt Readings from Last 3 Encounters:  08/02/15 289 lb (131.09 kg)  07/31/15 291 lb 0.1 oz (132 kg)  07/07/15 275 lb (124.739 kg)    Physical Exam  Constitutional: She appears well-developed. No distress.  HENT:  Head: Normocephalic.  Right Ear: External ear normal.  Left Ear: External ear normal.  Nose: Nose normal.  Mouth/Throat: Oropharynx is clear and  moist.  Eyes: Conjunctivae are normal. Pupils are equal, round, and reactive to light. Right eye exhibits no discharge. Left eye exhibits no discharge. Scleral icterus is present.  Neck: Normal range of motion. Neck supple. No JVD present. No tracheal deviation present. No thyromegaly present.  Cardiovascular: Normal rate, regular rhythm and normal heart sounds.   Pulmonary/Chest: No stridor. No  respiratory distress. She has no wheezes.  Abdominal: Soft. Bowel sounds are normal. She exhibits no distension and no mass. There is no tenderness. There is no rebound and no guarding.  Musculoskeletal: She exhibits tenderness. She exhibits no edema.  Lymphadenopathy:    She has no cervical adenopathy.  Neurological: She displays normal reflexes. No cranial nerve deficit. She exhibits normal muscle tone. Coordination abnormal.  Skin: Rash noted. No erythema. There is pallor.  Psychiatric: She has a normal mood and affect. Her behavior is normal. Judgment and thought content normal.  jaundiced, bruised No asterixis A/o/c  Lab Results  Component Value Date   WBC 1.7* 07/31/2015   HGB 7.4* 07/31/2015   HCT 22.2* 07/31/2015   PLT 34* 07/31/2015   GLUCOSE 85 07/31/2015   CHOL 164 06/02/2010   TRIG 97.0 06/02/2010   HDL 43.50 06/02/2010   LDLCALC 101* 06/02/2010   ALT 20 07/31/2015   AST 38 07/31/2015   NA 139 07/31/2015   K 3.6 07/31/2015   CL 104 07/31/2015   CREATININE 2.00* 07/31/2015   BUN 20 07/31/2015   CO2 24 07/31/2015   TSH 1.952 07/30/2015   INR 3.38* 07/31/2015   HGBA1C 3.9* 12/15/2014    Ct Chest Wo Contrast  07/30/2015  CLINICAL DATA:  Acute onset of generalized weakness and bilateral lower extremity swelling. Initial encounter. EXAM: CT CHEST WITHOUT CONTRAST TECHNIQUE: Multidetector CT imaging of the chest was performed following the standard protocol without IV contrast. COMPARISON:  Chest radiograph performed 07/29/2015 FINDINGS: Small left and trace right pleural effusions are seen, with minimal associated atelectasis. The lungs are otherwise clear. No masses are identified. The mediastinum is grossly unremarkable in appearance, aside from scattered coronary artery calcification. No mediastinal lymphadenopathy is seen. No pericardial effusion is identified. The great vessels are grossly unremarkable in appearance. No pericardial effusion The visualized portions of  thyroid gland are unremarkable. No axillary lymphadenopathy is seen. Diffuse soft tissue edema is noted tracking about the abdominal wall and left chest wall, likely reflecting mild anasarca. A moderate amount of ascites noted tracking about the liver and spleen. The spleen is enlarged. The patient is status post cholecystectomy, with clips noted at the gallbladder fossa. There is heterogeneity of the liver, difficult to fully assess without contrast. No acute osseous abnormalities are seen. IMPRESSION: 1. Small left and trace right pleural effusions, with minimal associated atelectasis. Lungs otherwise clear. 2. Diffuse soft tissue edema tracking about the abdominal wall and left chest wall, likely reflecting mild anasarca. 3. Scattered coronary artery calcification. 4. Splenomegaly. 5. Moderate volume ascites noted at the upper abdomen. 6. Heterogeneity of the liver, not well assessed without contrast. Would correlate with LFTs. Electronically Signed   By: Roanna Raider M.D.   On: 07/30/2015 03:04   Dg Chest Port 1 View  07/29/2015  CLINICAL DATA:  Sepsis. EXAM: PORTABLE CHEST 1 VIEW COMPARISON:  03/28/2015 FINDINGS: Stable cardiomegaly. Stable vascular congestion. No evidence of edema. No confluent airspace disease. Patient is rotated. No pleural effusion or pneumothorax. IMPRESSION: Stable cardiomegaly and vascular congestion. The superimposed acute process. Electronically Signed   By: Lujean Rave.D.  On: 07/29/2015 23:41    Assessment & Plan:   Diagnoses and all orders for this visit:  Other cirrhosis of liver (HCC) -     Amb Referral to Palliative Care  Jaundice -     Amb Referral to Palliative Care  Chronic low back pain with sciatica, sciatica laterality unspecified, unspecified back pain laterality -     Amb Referral to Palliative Care  Anxiety  Restless leg syndrome  Thrombocytopenia due to hypersplenism  Pancytopenia (HCC)  Other specified hypothyroidism  Severe  protein-calorie malnutrition (HCC)  Other orders -     LORazepam (ATIVAN) 0.5 MG tablet; Take 1 tablet (0.5 mg total) by mouth every 8 (eight) hours as needed for anxiety. -     rOPINIRole (REQUIP) 0.25 MG tablet; Take 1 tablet (0.25 mg total) by mouth at bedtime. -     hydrOXYzine (ATARAX/VISTARIL) 10 MG tablet; Take 1 tablet (10 mg total) by mouth 3 (three) times daily as needed for itching. -     feeding supplement, ENSURE ENLIVE, (ENSURE ENLIVE) LIQD; Take 237 mLs by mouth 2 (two) times daily between meals.   I have discontinued Ms. Pinela fentaNYL. I am also having her start on LORazepam, rOPINIRole, and hydrOXYzine. Additionally, I am having her maintain her Acidophilus, vitamin B-12, cholecalciferol, triamcinolone cream, loratadine, levothyroxine, albuterol, milk thistle, hydrocortisone, lactulose, ciprofloxacin, and feeding supplement (ENSURE ENLIVE).  Meds ordered this encounter  Medications  . LORazepam (ATIVAN) 0.5 MG tablet    Sig: Take 1 tablet (0.5 mg total) by mouth every 8 (eight) hours as needed for anxiety.    Dispense:  90 tablet    Refill:  2  . rOPINIRole (REQUIP) 0.25 MG tablet    Sig: Take 1 tablet (0.25 mg total) by mouth at bedtime.    Dispense:  30 tablet    Refill:  3  . hydrOXYzine (ATARAX/VISTARIL) 10 MG tablet    Sig: Take 1 tablet (10 mg total) by mouth 3 (three) times daily as needed for itching.    Dispense:  90 tablet    Refill:  0  . feeding supplement, ENSURE ENLIVE, (ENSURE ENLIVE) LIQD    Sig: Take 237 mLs by mouth 2 (two) times daily between meals.    Dispense:  60 Bottle    Refill:  6     Follow-up: Return in about 3 weeks (around 08/23/2015) for a follow-up visit.  Sonda Primes, MD

## 2015-08-02 NOTE — Assessment & Plan Note (Signed)
Ensure Enlive bid

## 2015-08-02 NOTE — Assessment & Plan Note (Signed)
Off levothroid since 2016 - nl TSH

## 2015-08-02 NOTE — Progress Notes (Signed)
Pre visit review using our clinic review tool, if applicable. No additional management support is needed unless otherwise documented below in the visit note. 

## 2015-08-02 NOTE — Assessment & Plan Note (Signed)
Lorazepam prn 

## 2015-08-03 LAB — CULTURE, BLOOD (ROUTINE X 2)
CULTURE: NO GROWTH
Culture: NO GROWTH

## 2015-08-09 ENCOUNTER — Other Ambulatory Visit (HOSPITAL_BASED_OUTPATIENT_CLINIC_OR_DEPARTMENT_OTHER): Payer: Medicare Other

## 2015-08-09 ENCOUNTER — Encounter: Payer: Self-pay | Admitting: Hematology & Oncology

## 2015-08-09 ENCOUNTER — Ambulatory Visit (HOSPITAL_BASED_OUTPATIENT_CLINIC_OR_DEPARTMENT_OTHER): Payer: Medicare Other | Admitting: Hematology & Oncology

## 2015-08-09 ENCOUNTER — Telehealth: Payer: Self-pay | Admitting: *Deleted

## 2015-08-09 ENCOUNTER — Other Ambulatory Visit (HOSPITAL_COMMUNITY)
Admission: AD | Admit: 2015-08-09 | Discharge: 2015-08-09 | Disposition: A | Discharge status: Home / Self Care | Payer: Medicare Other | Source: Ambulatory Visit | Attending: Hematology & Oncology | Admitting: Hematology & Oncology

## 2015-08-09 VITALS — BP 125/45 | HR 116 | Temp 98.1°F | Resp 18 | Ht 61.0 in | Wt 295.0 lb

## 2015-08-09 DIAGNOSIS — D72819 Decreased white blood cell count, unspecified: Secondary | ICD-10-CM

## 2015-08-09 DIAGNOSIS — R161 Splenomegaly, not elsewhere classified: Secondary | ICD-10-CM

## 2015-08-09 DIAGNOSIS — K7581 Nonalcoholic steatohepatitis (NASH): Secondary | ICD-10-CM

## 2015-08-09 DIAGNOSIS — D696 Thrombocytopenia, unspecified: Secondary | ICD-10-CM | POA: Diagnosis present

## 2015-08-09 DIAGNOSIS — K7460 Unspecified cirrhosis of liver: Secondary | ICD-10-CM

## 2015-08-09 DIAGNOSIS — D6959 Other secondary thrombocytopenia: Secondary | ICD-10-CM

## 2015-08-09 DIAGNOSIS — D693 Immune thrombocytopenic purpura: Secondary | ICD-10-CM

## 2015-08-09 DIAGNOSIS — K7290 Hepatic failure, unspecified without coma: Secondary | ICD-10-CM

## 2015-08-09 LAB — CBC WITH DIFFERENTIAL (CANCER CENTER ONLY)
BASO#: 0.1 10*3/uL (ref 0.0–0.2)
BASO%: 0.6 % (ref 0.0–2.0)
EOS%: 5.8 % (ref 0.0–7.0)
Eosinophils Absolute: 0.6 10*3/uL — ABNORMAL HIGH (ref 0.0–0.5)
HEMATOCRIT: 30.6 % — AB (ref 34.8–46.6)
HEMOGLOBIN: 10.3 g/dL — AB (ref 11.6–15.9)
LYMPH#: 1 10*3/uL (ref 0.9–3.3)
LYMPH%: 9.5 % — ABNORMAL LOW (ref 14.0–48.0)
MCH: 33.1 pg (ref 26.0–34.0)
MCHC: 33.7 g/dL (ref 32.0–36.0)
MCV: 98 fL (ref 81–101)
MONO#: 1.1 10*3/uL — ABNORMAL HIGH (ref 0.1–0.9)
MONO%: 10 % (ref 0.0–13.0)
NEUT%: 74.1 % (ref 39.6–80.0)
NEUTROS ABS: 8 10*3/uL — AB (ref 1.5–6.5)
Platelets: 78 10*3/uL — ABNORMAL LOW (ref 145–400)
RBC: 3.11 10*6/uL — ABNORMAL LOW (ref 3.70–5.32)
RDW: 18.3 % — AB (ref 11.1–15.7)
WBC: 10.7 10*3/uL — ABNORMAL HIGH (ref 3.9–10.0)

## 2015-08-09 LAB — COMPREHENSIVE METABOLIC PANEL
ALBUMIN: 2.3 g/dL — AB (ref 3.5–5.0)
ALK PHOS: 85 U/L (ref 38–126)
ALT: 24 U/L (ref 14–54)
ANION GAP: 9 (ref 5–15)
AST: 37 U/L (ref 15–41)
BUN: 15 mg/dL (ref 6–20)
CALCIUM: 8.4 mg/dL — AB (ref 8.9–10.3)
CHLORIDE: 102 mmol/L (ref 101–111)
CO2: 24 mmol/L (ref 22–32)
Creatinine, Ser: 1.14 mg/dL — ABNORMAL HIGH (ref 0.44–1.00)
GFR calc Af Amer: 57 mL/min — ABNORMAL LOW (ref >60)
GFR calc non Af Amer: 49 mL/min — ABNORMAL LOW (ref >60)
GLUCOSE: 105 mg/dL — AB (ref 65–99)
Potassium: 4 mmol/L (ref 3.5–5.1)
SODIUM: 135 mmol/L (ref 135–145)
Total Bilirubin: 13.7 mg/dL — ABNORMAL HIGH (ref 0.3–1.2)
Total Protein: 5.8 g/dL — ABNORMAL LOW (ref 6.5–8.1)

## 2015-08-09 LAB — CHCC SATELLITE - SMEAR

## 2015-08-09 NOTE — Telephone Encounter (Signed)
Spoke to Amy at [facility] and made new patient referral.

## 2015-08-09 NOTE — Progress Notes (Signed)
Hematology and Oncology Follow Up Visit  Lori Mills [id number] 06/08/1950 65 y.o. 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 Mills is back for follow-up. Unfortunately, her situation is declining. She was just discharged from the hospital. She was made there with hypo-tension. I'm unsure exactly was done for her. I guess some of her medicines were readjusted. When she was discharged, her bilirubin was 10.  She looks pretty bad right now. She's very cold. She is jaundiced. She is very weak. It seems like she is gaining quite a bit of weight because of fluid. Her weight was almost 300 pounds.  She's very tired. She does not have much energy at all.  She's not that hungry. She is nauseated.  I think it is apparent that liver failure is going to be her primary determinant of prognosis.  She understands full well that her liver is not doing well and is not going to get better.  I just feel bad for her.  She has this diffuse rash. She has psoriasis. This rash seems to be getting a lot worse. This is making her life also very difficult. She does have a dermatologist but I do think that she really needs to see him because of her very poor performance status.   Overall, her performance status is ECOG 3-4.   Medications:  Current outpatient prescriptions:  .  albuterol (PROVENTIL HFA;VENTOLIN HFA) 108 (90 BASE) MCG/ACT inhaler, Inhale 1-2 puffs into the lungs every 6 (six) hours as needed for wheezing or shortness of breath., Disp: 1 Inhaler, Rfl: 0 .  Cholecalciferol (VITAMIN D3) 1000 UNITS tablet, Take 1,000 Units by mouth daily.  , Disp: , Rfl:  .  feeding supplement, ENSURE ENLIVE, (ENSURE ENLIVE) LIQD, Take 237 mLs by mouth 2 (two) times daily between meals., Disp: 60 Bottle, Rfl: 6 .  hydrocortisone (ANUSOL-HC) 25 MG  suppository, Place 1 suppository (25 mg total) rectally 2 (two) times daily., Disp: 12 suppository, Rfl: 0 .  hydrOXYzine (ATARAX/VISTARIL) 10 MG tablet, Take 1 tablet (10 mg total) by mouth 3 (three) times daily as needed for itching., Disp: 90 tablet, Rfl: 0 .  Lactobacillus (ACIDOPHILUS) CAPS, Take 1 capsule by mouth daily. , Disp: , Rfl:  .  lactulose (CHRONULAC) 10 GM/15ML solution, Take 15 mLs (10 g total) by mouth 2 (two) times daily., Disp: 240 mL, Rfl: 0 .  levothyroxine (SYNTHROID, LEVOTHROID) 50 MCG tablet, take 1 tablet by mouth once daily, Disp: 30 tablet, Rfl: 11 .  loratadine (CLARITIN) 10 MG tablet, Take 1 tablet (10 mg total) by mouth daily. For allergies, Disp: 100 tablet, Rfl: 3 .  LORazepam (ATIVAN) 0.5 MG tablet, Take 1 tablet (0.5 mg total) by mouth every 8 (eight) hours as needed for anxiety., Disp: 90 tablet, Rfl: 2 .  milk thistle 175 MG tablet, Take 175 mg by mouth daily as needed (cleanse liver)., Disp: , Rfl:  .  rOPINIRole (REQUIP) 0.25 MG tablet, Take 1 tablet (0.25 mg total) by mouth at bedtime., Disp: 30 tablet, Rfl: 3 .  triamcinolone cream (KENALOG) 0.1 %, Apply topically 2 (two) times daily., Disp: 30 g, Rfl: 1 .  vitamin B-12 (CYANOCOBALAMIN) 1000 MCG tablet, Take 1,000 mcg by mouth daily.  , Disp: , Rfl:   Allergies:  Allergies  Allergen Reactions  . Cephalexin Itching    Can  take Augmentin  . Dilaudid [Hydromorphone Hcl]     Feeling depressed on it  . Doxycycline Photosensitivity  . Enalapril Maleate   . Fentanyl     crazy  . Hydrochlorothiazide W-Triamterene   . Ibuprofen     REACTION: pain in abd  . Oxycodone     rash  . Penicillins   . Spironolactone   . Tramadol     n/v  . Bactrim [Sulfamethoxazole-Trimethoprim] Rash    Past Medical History, Surgical history, Social history, and Family History were reviewed and updated.  Review of Systems: As above  Physical Exam:  height is 5\' 1"  (1.549 m) and weight is 295 lb (133.811 kg). Her oral  temperature is 98.1 F (36.7 C). Her blood pressure is 125/45 and her pulse is 116. Her respiration is 18.   Wt Readings from Last 3 Encounters:  08/09/15 295 lb (133.811 kg)  08/02/15 289 lb (131.09 kg)  07/31/15 291 lb 0.1 oz (132 kg)     Obese white female in no obvious distress. Head and neck exam shows no ocular or oral lesions. There are no palpable cervical or supraclavicular lymph nodes. Lungs are clear. Cardiac exam regular rate and rhythm with no murmurs, rubs or bruits. Abdomen is soft. She is morbidly obese. She has no fluid wave. There is no palpable liver or spleen tip. Extremities shows some chronic nonpitting edema of the lower legs. Skin exam shows numerous psoriatic plaques. 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 There is no ecchymoses or petechia. Neurological exam shows no focal neurological deficits.  Lab Results  Component Value Date   WBC 10.7* 08/09/2015   HGB 10.3* 08/09/2015   HCT 30.6* 08/09/2015   MCV 98 08/09/2015   PLT 78 Platelet count consistent in citrate* 08/09/2015     Chemistry      Component Value Date/Time   NA 139 07/31/2015 0521   K 3.6 07/31/2015 0521   CL 104 07/31/2015 0521   CO2 24 07/31/2015 0521   BUN 20 07/31/2015 0521   CREATININE 2.00* 07/31/2015 0521      Component Value Date/Time   CALCIUM 8.7* 07/31/2015 0521   ALKPHOS 85 07/31/2015 0521   AST 38 07/31/2015 0521   ALT 20 07/31/2015 0521   BILITOT 10.5* 07/31/2015 0521         Impression and Plan: Ms. 08/02/2015 is a 65 year old white female. She has progressive hepatic failure. I suspect that she probably has hepatorenal syndrome. I suspect she probably is cold because of her kidney function. I do not have his back yet but I would have to suspect that her kidney function probably is not all that great.  I told her that from my point of view, she does not have any blood issue. The platelet count I think is all from sequestering from her  splenomegaly.  She does not want to have any further interventions taken. Her bilirubin is almost 15 now. I think this is very good evidence that her liver is beginning to shut down. She does not want to go back to the hospital. She wants to have respect and dignity at home.  Unfortunately, her primary care doctor is out of town this week. Because this, I will try to help with getting her into hospice. [facility]. She is very agreeable to hospice.  I would not think that her prognosis would be more than a month area and given her degree of hyper-bilirubinemia and likely renal  insufficiency, I do still think her body has much reserve at all.  She is a DO NOT RESUSCITATE. She said she made that clear when she was hospitalized.  Her husband passed away last year so she understands that she will see him soon.  I do really feel that for this Mills. She is very nice. She just has a very difficult problem with her liver failure area did  I spent about 45 minutes with she and her sister. 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   Josph Macho, MD 3/6/20174:13 PM

## 2015-08-10 ENCOUNTER — Telehealth: Payer: Self-pay | Admitting: Internal Medicine

## 2015-08-10 NOTE — Telephone Encounter (Signed)
Amy called from Hospice want to know if Dr. Macario Golds will be the attending physician for Mrs. [place] and if Dr. Macario Golds want them to do the symptom management? Please call her back  # [phone]

## 2015-08-10 NOTE — Telephone Encounter (Signed)
Correction patient is requesting something to be sent in for yeast infection.  Patient just finished antibiotic from UTI.  Please follow up with patient.

## 2015-08-10 NOTE — Telephone Encounter (Signed)
Per stacy, ok to have dr plotnikov do both these requests---i have advised amy with hospice

## 2015-08-10 NOTE — Telephone Encounter (Signed)
Requesting verbal order for DNR for Hospice doctor to sign.  Also states that patient has a UTI and is requesting something to be sent to patients pharmacy.

## 2015-08-11 ENCOUNTER — Telehealth: Payer: Self-pay | Admitting: Internal Medicine

## 2015-08-11 MED ORDER — FLUCONAZOLE 150 MG PO TABS: 150.0000 mg | ORAL_TABLET | Freq: Once | ORAL | Status: AC

## 2015-08-11 MED ORDER — MORPHINE SULFATE 15 MG PO TABS: 15.0000 mg | ORAL_TABLET | Freq: Four times a day (QID) | ORAL | Status: AC | PRN

## 2015-08-11 MED ORDER — CLOBETASOL PROPIONATE 0.05 % EX CREA: 1.0000 "application " | TOPICAL_CREAM | Freq: Two times a day (BID) | CUTANEOUS | Status: AC

## 2015-08-11 NOTE — Telephone Encounter (Signed)
Diflucan sent to pharmacy. Ok for DNR.

## 2015-08-11 NOTE — Telephone Encounter (Signed)
Clobetasol cream sent to pharmacy for psoriasis  Morphine rx printed - we can try it.

## 2015-08-11 NOTE — Telephone Encounter (Signed)
Please advise on below in PCP's absence. Thanks!

## 2015-08-11 NOTE — Telephone Encounter (Signed)
Please advise in dr plotnikov's absence---thanks 

## 2015-08-11 NOTE — Telephone Encounter (Signed)
She has flaking often from her psoriasis. She has taken morphine before.

## 2015-08-11 NOTE — Telephone Encounter (Signed)
Is the flaking related to the psoriasis or dry skin?  She has several pain medication allergies -- can she takes morphine

## 2015-08-11 NOTE — Telephone Encounter (Signed)
Patient is also needing pain medication for generalized pain and also right hip and back pain due to liver disease--supposedly only tier 1 drug due to insurance coverage ---also notice she is allergic to tylenol and others---but since she is on hospice now, i would think tier 2 would now be ok ???---patient also has really bad psiorasis and all other itching meds she is on is not working---is there something else that can be called in for itching?----per keisha with hospice---tele #[phone]----please advise, thanks

## 2015-08-11 NOTE — Telephone Encounter (Signed)
Hospice is calling to advise that the patient is in need to pain medication. They state that a request was sent in over a week ago with no response. She also states that the patient's skin is flaking, and they were seeking a verbal order for something on that. Please contact them back

## 2015-08-12 NOTE — Telephone Encounter (Signed)
I called Kisha at [facility]. Informed her of below. She states she obtained a order for Morphine from the Hospice physician. I shredded the script Dr. Lawerance Bach signed.

## 2015-08-16 ENCOUNTER — Ambulatory Visit: Payer: Medicare Other | Admitting: Internal Medicine

## 2015-08-17 ENCOUNTER — Telehealth: Payer: Self-pay | Admitting: Internal Medicine

## 2015-08-17 NOTE — Telephone Encounter (Signed)
Ok Benadryl; d/c Hydroxyzine then Thx

## 2015-08-17 NOTE — Telephone Encounter (Signed)
[facility] informed of below.

## 2015-08-17 NOTE — Telephone Encounter (Signed)
Keisha from Hospice states pt is still itching and the ointment is not helping.  She is hoping to start her on benedryl and the pharmacy is [facility] on [address] She can be reached at [phone]

## 2015-08-18 ENCOUNTER — Telehealth: Payer: Self-pay | Admitting: Internal Medicine

## 2015-08-18 NOTE — Telephone Encounter (Signed)
Patient has been transported to [facility] for end of life, today.

## 2015-08-18 NOTE — Telephone Encounter (Signed)
Noted. Thx.

## 2015-08-23 ENCOUNTER — Telehealth: Payer: Self-pay

## 2015-08-23 NOTE — Telephone Encounter (Signed)
On 08/23/2015 I received a death certificate from [facility] (original). The death certificate is for burial. The patient is a patient of Doctor Plotnikov. The death certificate will be taken to [facility] this pm for signature. On 09/22/15 I received the death certificate back from Doctor Plotnikov. I got the death certificate ready and called the funeral home to let them know the death certificate is ready for pickup.

## 2015-08-30 ENCOUNTER — Telehealth: Payer: Self-pay | Admitting: Internal Medicine

## 2015-08-30 NOTE — Telephone Encounter (Signed)
Pt's son informed. He states since he called here the funeral home informed him they had received the signed death certificate.

## 2015-08-30 NOTE — Telephone Encounter (Signed)
I signed it a while ago Thx

## 2015-08-30 NOTE — Telephone Encounter (Signed)
Pt son called in and wanted to see if Dr Macario Golds has seen the dealth certificate?  Has he seen this and signed it?    Son number is [phone]

## 2015-09-04 DEATH — deceased

## 2015-09-10 ENCOUNTER — Ambulatory Visit: Payer: Medicare Other | Admitting: Internal Medicine

## 2016-12-28 IMAGING — US US ABDOMEN COMPLETE
1 series · 14 of 25 positions shown · non-contrast
Comparison: 11/29/09

CLINICAL DATA: Assess for splenomegaly and cirrhosis. History of
NASH

EXAM:
ULTRASOUND ABDOMEN COMPLETE

[Series 1: us abdomen complete · 0.21mm/px · 14 of 65 slices shown]
[im 1/65]
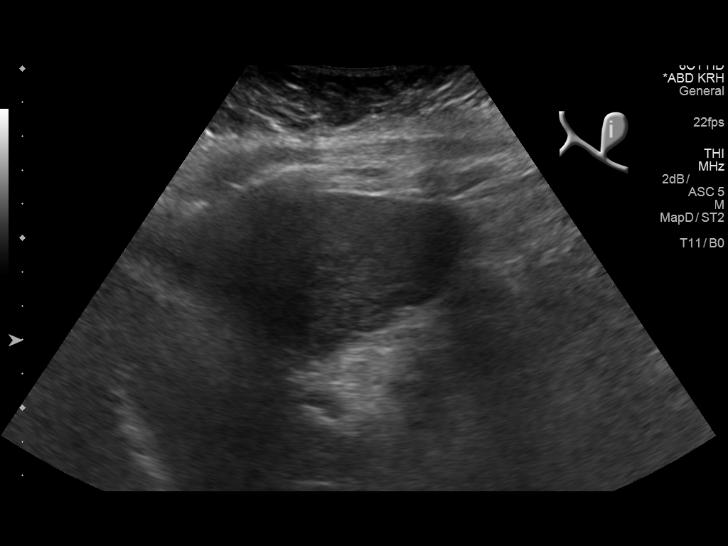
[im 6/65]
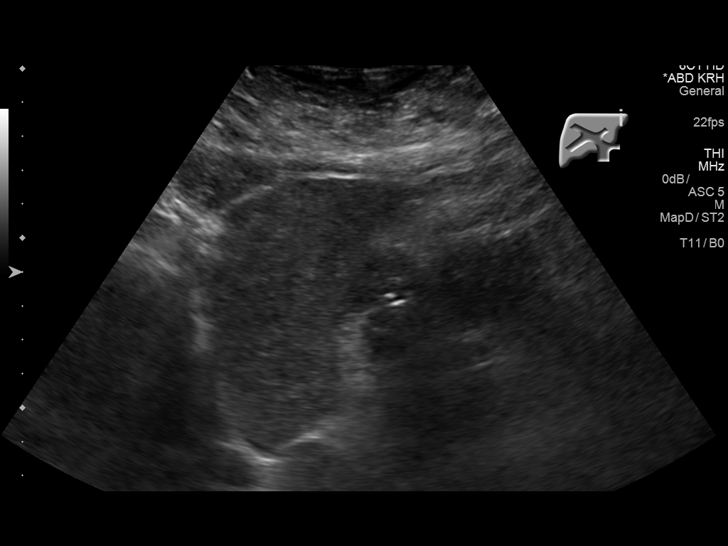
[im 11/65]
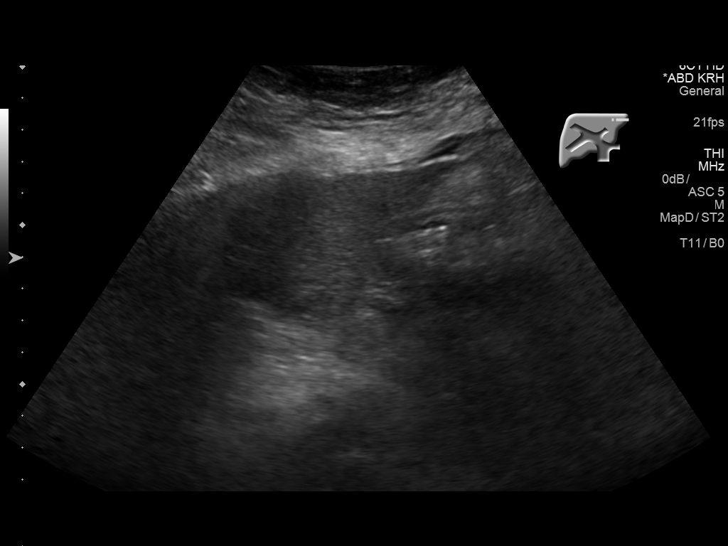
[im 17/65]
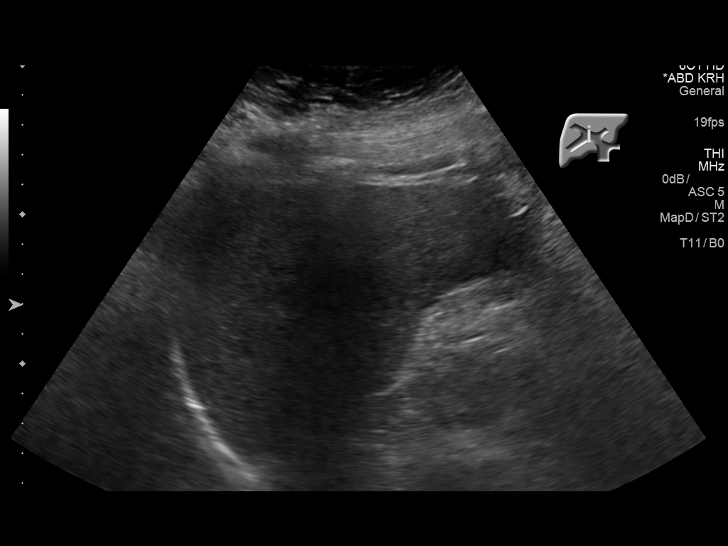
[im 22/65]
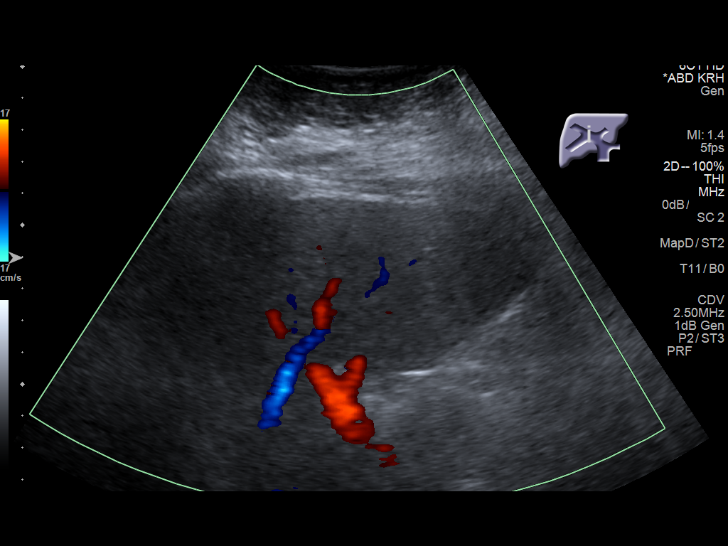
[im 25/65]
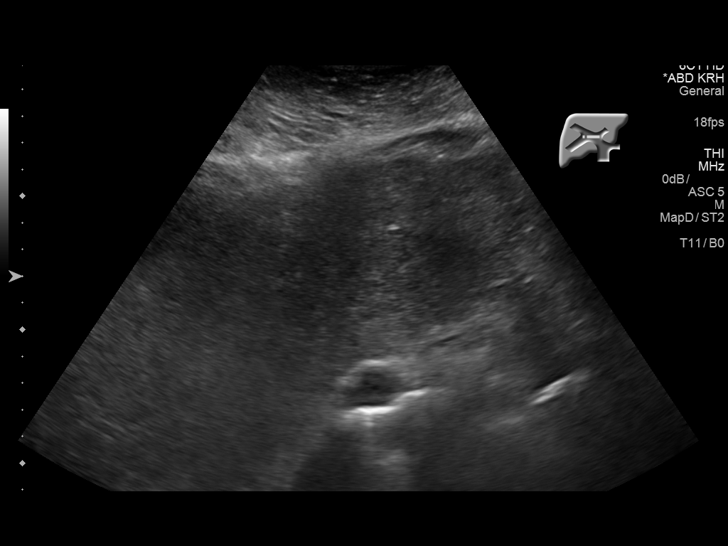
[im 30/65]
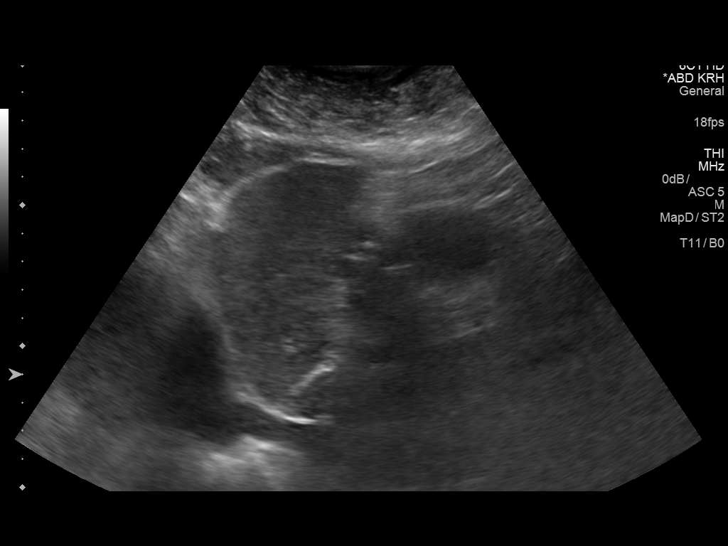
[im 35/65]
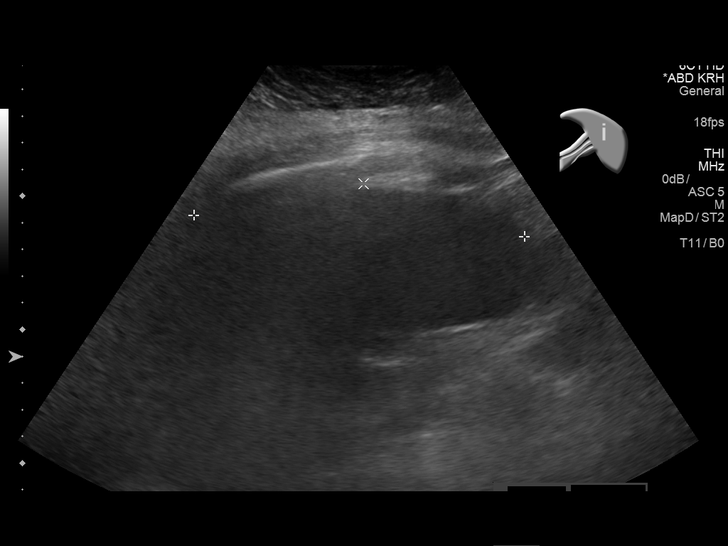
[im 41/65]
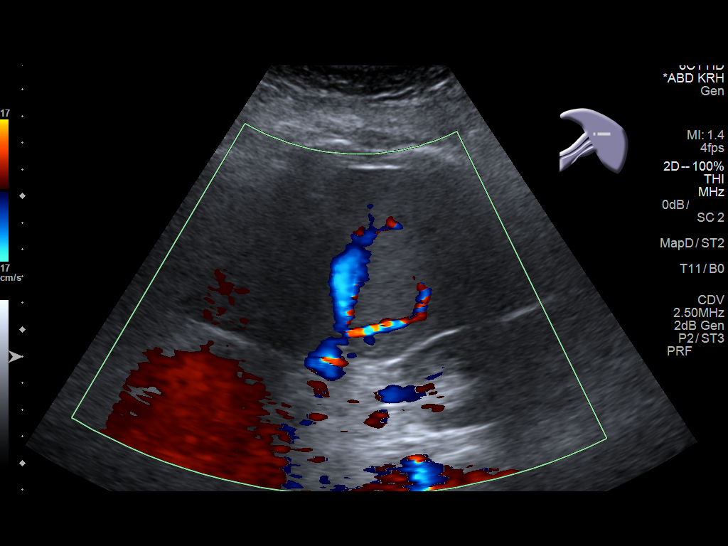
[im 43/65]
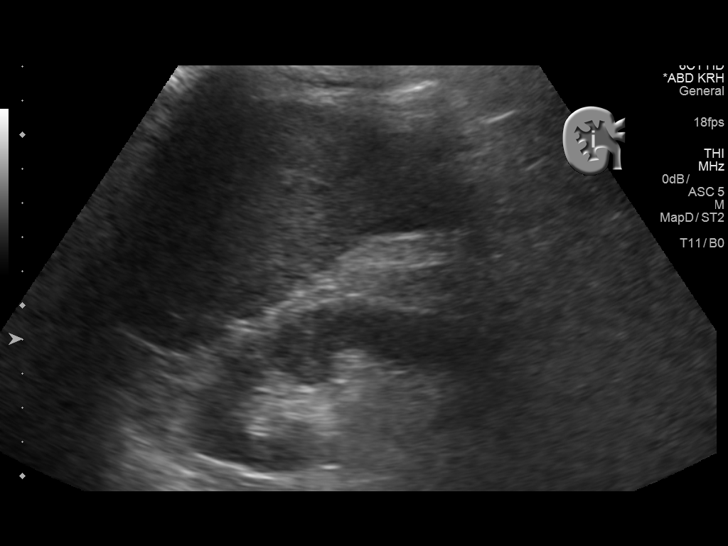
[im 49/65]
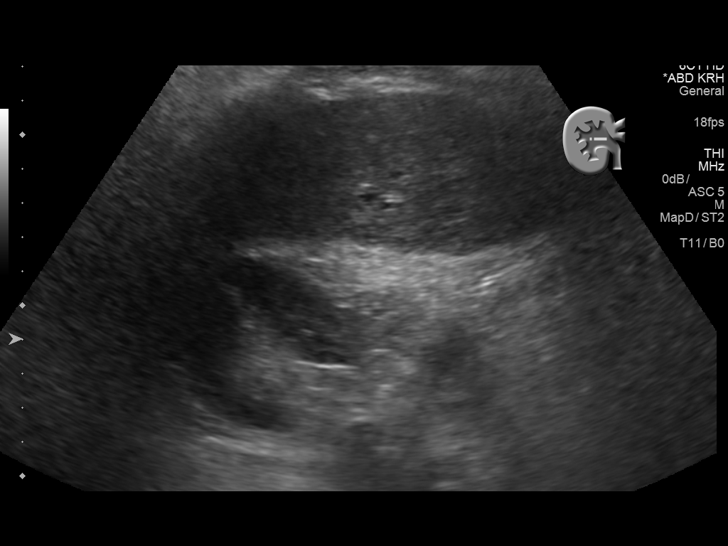
[im 54/65]
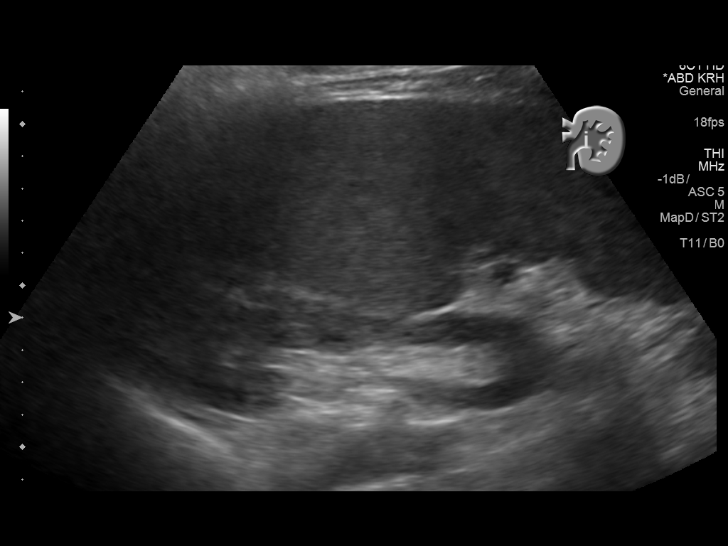
[im 59/65]
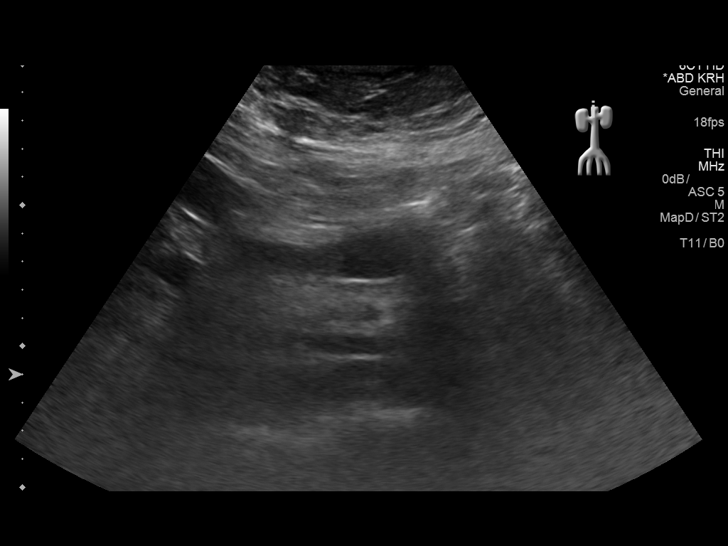
[im 65/65]
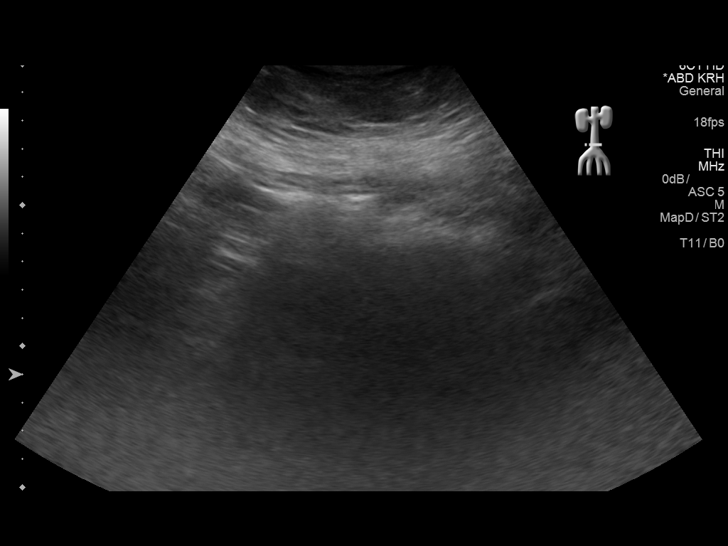

[14 of 25 positions shown; findings below may reference images not displayed]

FINDINGS: Gallbladder: Previous cholecystectomy.

Common bile duct: Diameter: 8 mm

Liver: The liver has a coarsened echotexture. No focal lesion
identified.

IVC: No abnormality visualized.

Pancreas: Visualized portion unremarkable.

Spleen: The spleen measures 15.3 cm in length and has a volume of
5535 cubic cm. Previously the spleen was measured at 15.4 cm with a
volume of 872 cubic cm.

Right Kidney: Length: 9.7 cm. Echogenicity within normal limits. No
mass or hydronephrosis visualized.

Left Kidney: Length: 11.9 cm. Echogenicity within normal limits. No
mass or hydronephrosis visualized.

Abdominal aorta: Measures 2.5 cm in maximum AP dimension.

Other findings: None.
IMPRESSION: 1. Splenomegaly. The spleen has a volume of 5535 cubic cm. This is
compared with 872 cubic cm previously.
2. The liver has a coarsened echotexture compatible with the
clinical history of cirrhosis.
3. Mild increase caliber of the common bile duct measuring 8 mm.

## 2017-05-26 IMAGING — MG MM DIGITAL SCREENING BILAT W/ CAD
4 series · 4 of 4 positions shown · non-contrast
Comparison: Previous exam(s).

CLINICAL DATA: Screening.

EXAM:
DIGITAL SCREENING BILATERAL MAMMOGRAM WITH CAD

[R CC]
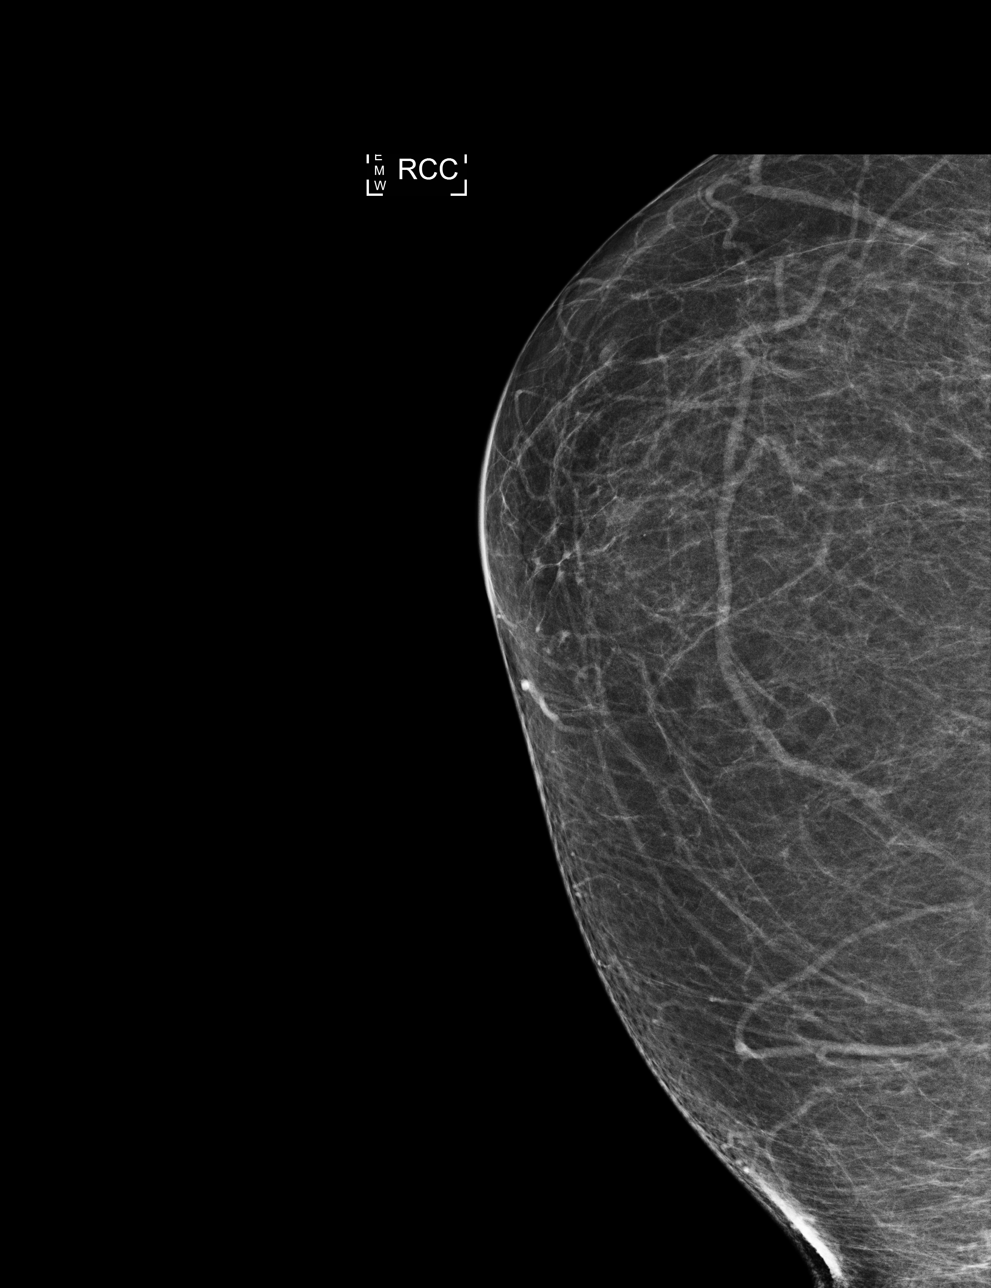

[L MLO]
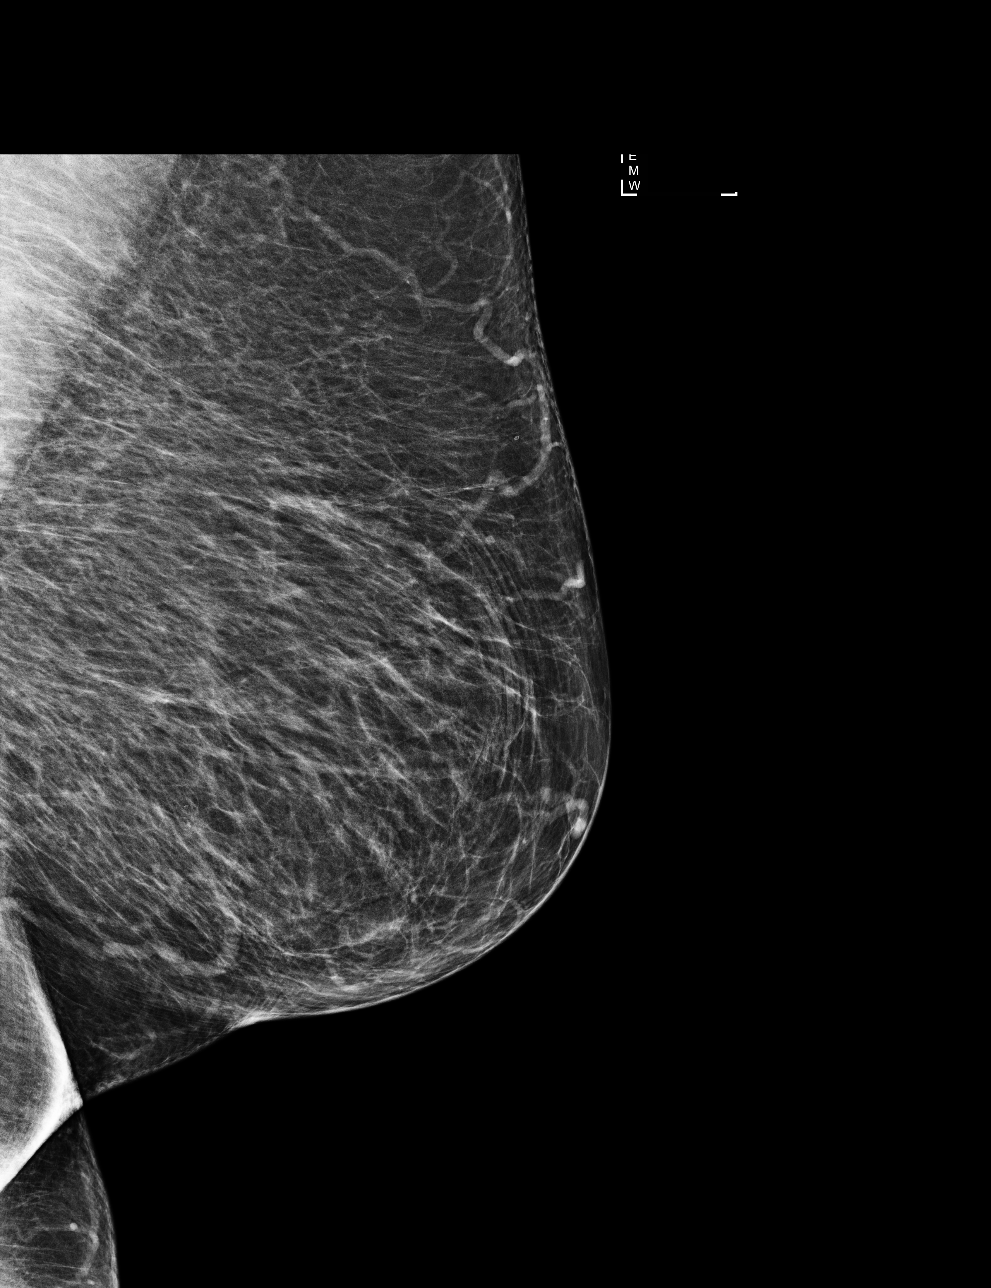

[L CC]
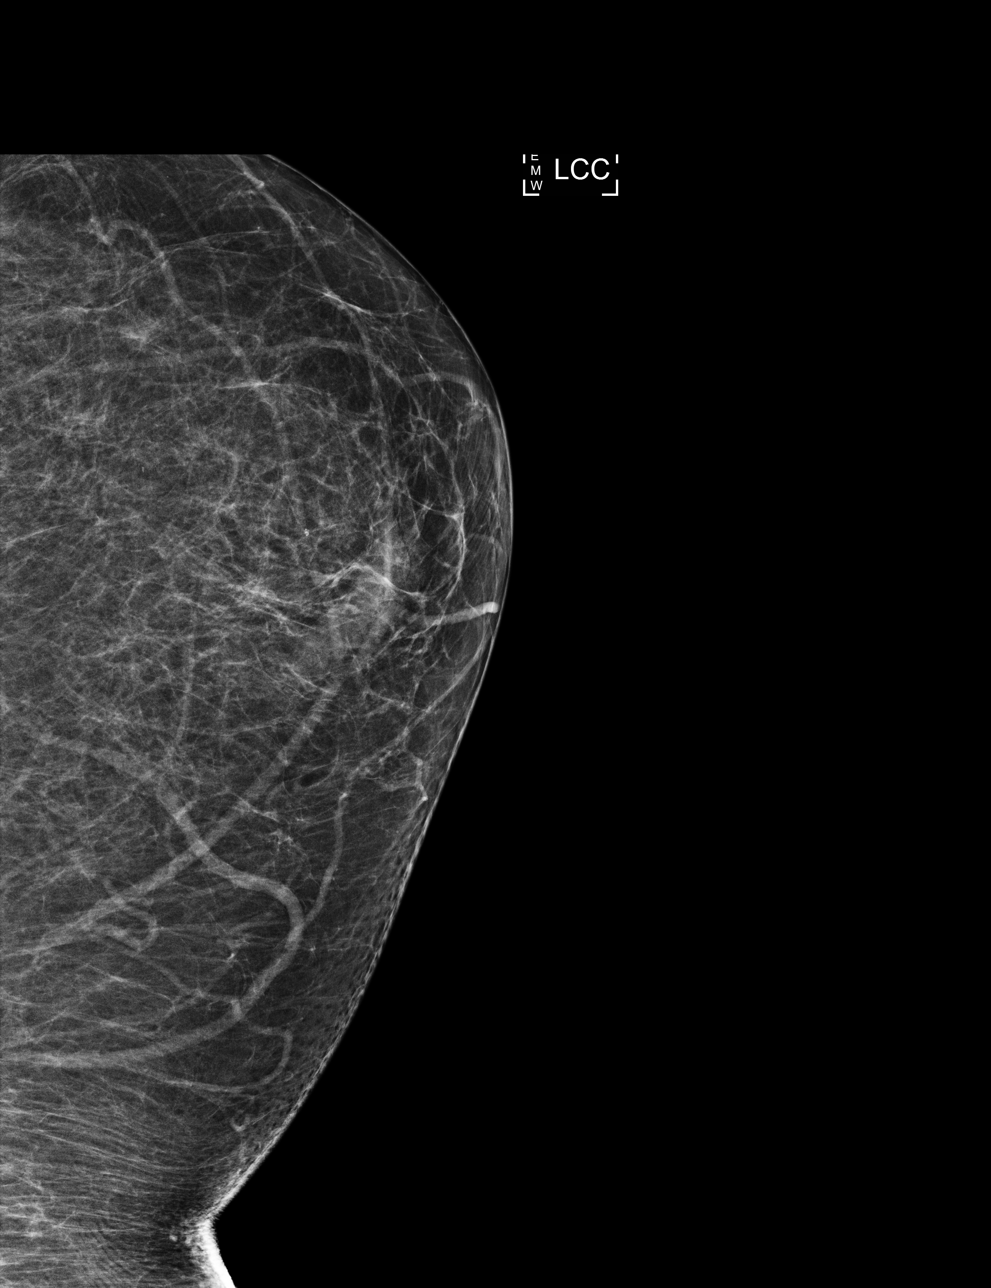

[R MLO]
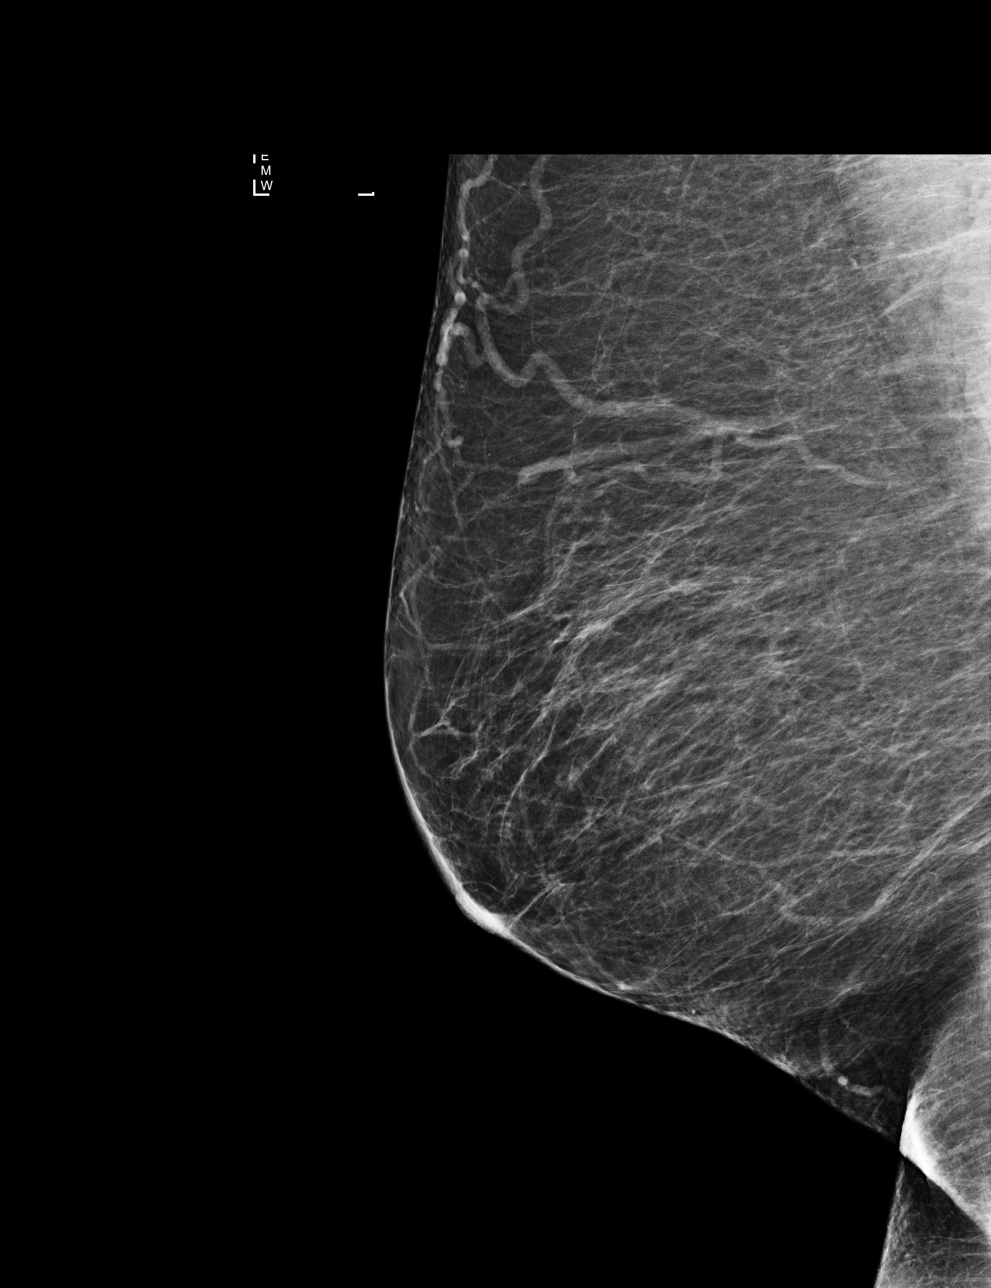

[4 of 4 positions shown; findings below may reference images not displayed]

ACR Breast Density Category b: There are scattered areas of
fibroglandular density.
FINDINGS: There are no findings suspicious for malignancy. Images were
processed with CAD.
IMPRESSION: No mammographic evidence of malignancy. A result letter of this
screening mammogram will be mailed directly to the patient.

RECOMMENDATION:
Screening mammogram in one year. (Code:AS-G-LCT)

BI-RADS CATEGORY  1: Negative.
